# Patient Record
Sex: Female | Born: 1975 | Race: White | Hispanic: No | Marital: Married | Smoking: Never smoker
Health system: Southern US, Community
[De-identification: ages and names within clinical notes are randomized; demographics above are authoritative.]

## PROBLEM LIST (undated history)

## (undated) DIAGNOSIS — E7800 Pure hypercholesterolemia, unspecified: Secondary | ICD-10-CM

## (undated) DIAGNOSIS — M5136 Other intervertebral disc degeneration, lumbar region: Secondary | ICD-10-CM

## (undated) DIAGNOSIS — M545 Low back pain, unspecified: Secondary | ICD-10-CM

## (undated) DIAGNOSIS — L900 Lichen sclerosus et atrophicus: Secondary | ICD-10-CM

## (undated) DIAGNOSIS — G932 Benign intracranial hypertension: Secondary | ICD-10-CM

## (undated) DIAGNOSIS — F329 Major depressive disorder, single episode, unspecified: Secondary | ICD-10-CM

## (undated) DIAGNOSIS — F3289 Other specified depressive episodes: Secondary | ICD-10-CM

## (undated) DIAGNOSIS — Q210 Ventricular septal defect: Secondary | ICD-10-CM

## (undated) DIAGNOSIS — N941 Unspecified dyspareunia: Secondary | ICD-10-CM

## (undated) DIAGNOSIS — R635 Abnormal weight gain: Secondary | ICD-10-CM

## (undated) DIAGNOSIS — K219 Gastro-esophageal reflux disease without esophagitis: Secondary | ICD-10-CM

## (undated) DIAGNOSIS — G4730 Sleep apnea, unspecified: Secondary | ICD-10-CM

## (undated) DIAGNOSIS — J309 Allergic rhinitis, unspecified: Secondary | ICD-10-CM

## (undated) DIAGNOSIS — M8700 Idiopathic aseptic necrosis of unspecified bone: Secondary | ICD-10-CM

## (undated) DIAGNOSIS — D320 Benign neoplasm of cerebral meninges: Secondary | ICD-10-CM

## (undated) DIAGNOSIS — M51369 Other intervertebral disc degeneration, lumbar region without mention of lumbar back pain or lower extremity pain: Secondary | ICD-10-CM

## (undated) DIAGNOSIS — C569 Malignant neoplasm of unspecified ovary: Secondary | ICD-10-CM

## (undated) DIAGNOSIS — E041 Nontoxic single thyroid nodule: Secondary | ICD-10-CM

## (undated) DIAGNOSIS — E669 Obesity, unspecified: Secondary | ICD-10-CM

## (undated) DIAGNOSIS — G8929 Other chronic pain: Secondary | ICD-10-CM

## (undated) DIAGNOSIS — H4710 Unspecified papilledema: Secondary | ICD-10-CM

## (undated) DIAGNOSIS — R519 Headache, unspecified: Secondary | ICD-10-CM

## (undated) HISTORY — PX: THYROID SURGERY: SHX805

## (undated) HISTORY — DX: Abnormal weight gain: R63.5

## (undated) HISTORY — DX: Major depressive disorder, single episode, unspecified: F32.9

## (undated) HISTORY — DX: Other specified depressive episodes: F32.89

## (undated) HISTORY — PX: ANGIOPLASTY: SHX39

## (undated) HISTORY — DX: Benign neoplasm of cerebral meninges: D32.0

## (undated) HISTORY — PX: BRAIN SURGERY: SHX531

## (undated) HISTORY — PX: OTHER SURGICAL HISTORY: SHX169

## (undated) HISTORY — PX: OOPHORECTOMY: SHX86

## (undated) HISTORY — DX: Nontoxic single thyroid nodule: E04.1

## (undated) HISTORY — DX: Malignant neoplasm of unspecified ovary: C56.9

## (undated) HISTORY — PX: FOOT SURGERY: SHX648

## (undated) HISTORY — PX: SPINAL FUSION: SHX223

## (undated) HISTORY — DX: Allergic rhinitis, unspecified: J30.9

## (undated) HISTORY — PX: BACK SURGERY: SHX140

---

## 1998-12-23 ENCOUNTER — Ambulatory Visit (HOSPITAL_COMMUNITY): Admission: RE | Admit: 1998-12-23 | Discharge: 1998-12-23 | Payer: Self-pay

## 2001-02-20 ENCOUNTER — Ambulatory Visit (HOSPITAL_COMMUNITY): Admission: RE | Admit: 2001-02-20 | Discharge: 2001-02-20 | Payer: Self-pay | Admitting: Obstetrics and Gynecology

## 2001-02-20 ENCOUNTER — Encounter: Payer: Self-pay | Admitting: Obstetrics and Gynecology

## 2001-02-24 ENCOUNTER — Encounter (INDEPENDENT_AMBULATORY_CARE_PROVIDER_SITE_OTHER): Payer: Self-pay

## 2001-02-24 ENCOUNTER — Inpatient Hospital Stay (HOSPITAL_COMMUNITY): Admission: RE | Admit: 2001-02-24 | Discharge: 2001-02-27 | Payer: Self-pay | Admitting: Obstetrics and Gynecology

## 2001-02-24 DIAGNOSIS — C569 Malignant neoplasm of unspecified ovary: Secondary | ICD-10-CM

## 2001-02-24 HISTORY — DX: Malignant neoplasm of unspecified ovary: C56.9

## 2001-03-15 ENCOUNTER — Encounter: Payer: Self-pay | Admitting: Otolaryngology

## 2001-03-15 ENCOUNTER — Ambulatory Visit (HOSPITAL_COMMUNITY): Admission: RE | Admit: 2001-03-15 | Discharge: 2001-03-15 | Payer: Self-pay | Admitting: Otolaryngology

## 2001-03-28 ENCOUNTER — Ambulatory Visit: Admission: RE | Admit: 2001-03-28 | Discharge: 2001-03-28 | Payer: Self-pay | Admitting: Gynecology

## 2001-03-28 ENCOUNTER — Encounter: Payer: Self-pay | Admitting: Otolaryngology

## 2001-03-28 ENCOUNTER — Encounter (INDEPENDENT_AMBULATORY_CARE_PROVIDER_SITE_OTHER): Payer: Self-pay | Admitting: *Deleted

## 2001-03-28 ENCOUNTER — Ambulatory Visit (HOSPITAL_COMMUNITY): Admission: RE | Admit: 2001-03-28 | Discharge: 2001-03-28 | Payer: Self-pay | Admitting: Otolaryngology

## 2001-05-02 ENCOUNTER — Encounter: Payer: Self-pay | Admitting: Otolaryngology

## 2001-05-04 ENCOUNTER — Encounter (INDEPENDENT_AMBULATORY_CARE_PROVIDER_SITE_OTHER): Payer: Self-pay | Admitting: Specialist

## 2001-05-04 ENCOUNTER — Ambulatory Visit (HOSPITAL_COMMUNITY): Admission: RE | Admit: 2001-05-04 | Discharge: 2001-05-05 | Payer: Self-pay | Admitting: Otolaryngology

## 2001-05-04 DIAGNOSIS — D34 Benign neoplasm of thyroid gland: Secondary | ICD-10-CM

## 2001-05-04 HISTORY — DX: Benign neoplasm of thyroid gland: D34

## 2001-08-01 ENCOUNTER — Encounter: Payer: Self-pay | Admitting: Obstetrics and Gynecology

## 2001-08-01 ENCOUNTER — Ambulatory Visit (HOSPITAL_COMMUNITY): Admission: RE | Admit: 2001-08-01 | Discharge: 2001-08-01 | Payer: Self-pay | Admitting: Internal Medicine

## 2001-11-10 ENCOUNTER — Encounter: Payer: Self-pay | Admitting: Internal Medicine

## 2001-11-10 ENCOUNTER — Ambulatory Visit (HOSPITAL_COMMUNITY): Admission: RE | Admit: 2001-11-10 | Discharge: 2001-11-10 | Payer: Self-pay | Admitting: Internal Medicine

## 2001-11-27 ENCOUNTER — Ambulatory Visit (HOSPITAL_COMMUNITY): Admission: RE | Admit: 2001-11-27 | Discharge: 2001-11-27 | Payer: Self-pay | Admitting: Family Medicine

## 2001-11-27 ENCOUNTER — Encounter: Payer: Self-pay | Admitting: Family Medicine

## 2002-04-20 ENCOUNTER — Ambulatory Visit (HOSPITAL_COMMUNITY): Admission: RE | Admit: 2002-04-20 | Discharge: 2002-04-20 | Payer: Self-pay | Admitting: Emergency Medicine

## 2002-04-20 ENCOUNTER — Encounter: Payer: Self-pay | Admitting: Internal Medicine

## 2002-08-02 ENCOUNTER — Encounter: Payer: Self-pay | Admitting: Obstetrics and Gynecology

## 2002-08-02 ENCOUNTER — Ambulatory Visit (HOSPITAL_COMMUNITY): Admission: RE | Admit: 2002-08-02 | Discharge: 2002-08-02 | Payer: Self-pay | Admitting: Obstetrics and Gynecology

## 2003-02-18 ENCOUNTER — Ambulatory Visit (HOSPITAL_COMMUNITY): Admission: RE | Admit: 2003-02-18 | Discharge: 2003-02-18 | Payer: Self-pay | Admitting: Obstetrics and Gynecology

## 2003-09-06 ENCOUNTER — Ambulatory Visit (HOSPITAL_COMMUNITY): Admission: RE | Admit: 2003-09-06 | Discharge: 2003-09-06 | Payer: Self-pay | Admitting: Obstetrics and Gynecology

## 2004-03-24 ENCOUNTER — Ambulatory Visit (HOSPITAL_COMMUNITY): Admission: RE | Admit: 2004-03-24 | Discharge: 2004-03-24 | Payer: Self-pay | Admitting: Obstetrics and Gynecology

## 2004-10-16 ENCOUNTER — Ambulatory Visit (HOSPITAL_COMMUNITY): Admission: RE | Admit: 2004-10-16 | Discharge: 2004-10-16 | Payer: Self-pay | Admitting: Family Medicine

## 2004-11-03 ENCOUNTER — Ambulatory Visit (HOSPITAL_COMMUNITY): Admission: RE | Admit: 2004-11-03 | Discharge: 2004-11-03 | Payer: Self-pay | Admitting: Obstetrics and Gynecology

## 2005-06-11 ENCOUNTER — Ambulatory Visit (HOSPITAL_COMMUNITY): Admission: RE | Admit: 2005-06-11 | Discharge: 2005-06-11 | Payer: Self-pay | Admitting: Obstetrics and Gynecology

## 2005-12-02 ENCOUNTER — Ambulatory Visit (HOSPITAL_COMMUNITY): Admission: RE | Admit: 2005-12-02 | Discharge: 2005-12-02 | Payer: Self-pay | Admitting: Obstetrics and Gynecology

## 2006-07-12 ENCOUNTER — Ambulatory Visit (HOSPITAL_COMMUNITY): Admission: RE | Admit: 2006-07-12 | Discharge: 2006-07-12 | Payer: Self-pay | Admitting: Obstetrics and Gynecology

## 2007-06-23 ENCOUNTER — Encounter: Admission: RE | Admit: 2007-06-23 | Discharge: 2007-06-23 | Payer: Self-pay | Admitting: Family Medicine

## 2007-08-03 ENCOUNTER — Ambulatory Visit: Payer: Self-pay | Admitting: Cardiovascular Disease

## 2007-08-18 ENCOUNTER — Ambulatory Visit: Payer: Self-pay

## 2007-08-18 ENCOUNTER — Encounter: Payer: Self-pay | Admitting: Cardiovascular Disease

## 2007-08-23 ENCOUNTER — Encounter: Payer: Self-pay | Admitting: Endocrinology

## 2007-09-28 ENCOUNTER — Ambulatory Visit: Payer: Self-pay | Admitting: Endocrinology

## 2007-09-28 DIAGNOSIS — Z8543 Personal history of malignant neoplasm of ovary: Secondary | ICD-10-CM | POA: Insufficient documentation

## 2007-09-28 DIAGNOSIS — E041 Nontoxic single thyroid nodule: Secondary | ICD-10-CM | POA: Insufficient documentation

## 2007-09-28 DIAGNOSIS — F3289 Other specified depressive episodes: Secondary | ICD-10-CM | POA: Insufficient documentation

## 2007-09-28 DIAGNOSIS — C569 Malignant neoplasm of unspecified ovary: Secondary | ICD-10-CM | POA: Insufficient documentation

## 2007-09-28 DIAGNOSIS — D320 Benign neoplasm of cerebral meninges: Secondary | ICD-10-CM | POA: Insufficient documentation

## 2007-09-28 DIAGNOSIS — F329 Major depressive disorder, single episode, unspecified: Secondary | ICD-10-CM | POA: Insufficient documentation

## 2007-09-28 DIAGNOSIS — J309 Allergic rhinitis, unspecified: Secondary | ICD-10-CM | POA: Insufficient documentation

## 2007-09-29 ENCOUNTER — Telehealth: Payer: Self-pay | Admitting: Endocrinology

## 2008-04-02 ENCOUNTER — Encounter: Payer: Self-pay | Admitting: Family Medicine

## 2008-10-09 ENCOUNTER — Ambulatory Visit (HOSPITAL_COMMUNITY): Admission: RE | Admit: 2008-10-09 | Discharge: 2008-10-09 | Payer: Self-pay | Admitting: Obstetrics and Gynecology

## 2008-10-28 ENCOUNTER — Ambulatory Visit: Payer: Self-pay | Admitting: Physical Medicine and Rehabilitation

## 2009-05-23 ENCOUNTER — Ambulatory Visit: Payer: Self-pay | Admitting: Endocrinology

## 2009-05-23 DIAGNOSIS — R635 Abnormal weight gain: Secondary | ICD-10-CM | POA: Insufficient documentation

## 2009-05-30 ENCOUNTER — Ambulatory Visit: Payer: Self-pay | Admitting: Endocrinology

## 2009-06-01 LAB — CONVERTED CEMR LAB
BUN: 10 mg/dL (ref 6–23)
CO2: 30 meq/L (ref 19–32)
Calcium: 9.5 mg/dL (ref 8.4–10.5)
Cortisol, Plasma: 0.3 ug/dL
Direct LDL: 170.5 mg/dL
GFR calc non Af Amer: 103.57 mL/min (ref >60)
Glucose, Bld: 98 mg/dL (ref 70–99)
HDL: 44.5 mg/dL (ref >39.00)
Potassium: 3.9 meq/L (ref 3.5–5.1)
Sodium: 141 meq/L (ref 135–145)
VLDL: 20.8 mg/dL (ref 0.0–40.0)

## 2009-07-07 ENCOUNTER — Telehealth: Payer: Self-pay | Admitting: Endocrinology

## 2009-12-04 ENCOUNTER — Inpatient Hospital Stay (HOSPITAL_COMMUNITY): Admission: RE | Admit: 2009-12-04 | Discharge: 2009-12-05 | Payer: Self-pay | Admitting: Orthopedic Surgery

## 2010-03-28 ENCOUNTER — Encounter: Payer: Self-pay | Admitting: Obstetrics and Gynecology

## 2010-04-09 NOTE — Assessment & Plan Note (Signed)
Summary: LAST SEEN 7/09, REQ LAB/TRIAGE SAID OV/CD   Vital Signs:  Patient profile:   35 year old female Height:      69 inches (175.26 cm) Weight:      217.75 pounds (98.98 kg) BMI:     32.27 O2 Sat:      95 % on Room air Temp:     97.2 degrees F (36.22 degrees C) oral Pulse rate:   92 / minute BP sitting:   110 / 78  (left arm) Cuff size:   regular  Vitals Entered By: Josph Macho RMA (May 23, 2009 10:54 AM)  O2 Flow:  Room air CC: Office Visit/ pt states she is no longer taking Omeprazole, Tri-Sprintec, or Synthroid/ CF   Primary Provider:  Lonie Peak, PA  CC:  Office Visit/ pt states she is no longer taking Omeprazole, Tri-Sprintec, and or Synthroid/ CF.  History of Present Illness: pt had right thyroid lobectomy in 2009.  pathol was benign.  pt says she does not notice any lump in the neck. she stopped synthroid.  she c/o weight gain. she also c/o excessive appetite.  Current Medications (verified): 1)  Omeprazole 20 Mg  Cpdr (Omeprazole) .... Take 1 By Mouth Once Daily Prn 2)  Tri-Sprintec 0.035 Mg  Tabs (Norgestimate-Ethinyl Estradiol) .... Take 1 By Mouth Qd 3)  Fluticasone Propionate 50 Mcg/act  Susp (Fluticasone Propionate) .... Use Prn 4)  Kls Aller-Tec 10 Mg  Tabs (Cetirizine Hcl) .... Take 1 By Mouth Qd 5)  Allergy Shot .... Get Once A Week 6)  Synthroid 25 Mcg  Tabs (Levothyroxine Sodium) .... Qd 7)  Pristiq .... Daily 8)  Ambien .... Once Daily  Allergies (verified): 1)  ! 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  pristiq helps depression.  Physical Exam  General:  obese.   Skin:  no darkened striae on the abdomen.   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 change  Problem # 2:  weight gain uncertain etiology  Medications Added to Medication List This Visit: 1)  Pristiq  .... Daily 2)  Ambien  .... Once daily 3)  Dexamethasone 1 Mg Tabs (Dexamethasone) .... Take at 10 pm, the night before blood test  Other Orders: Est. Patient Level III (62952)  Patient Instructions: 1)  you should do a "dexamethasone suppression test."  for this, you would take dexamethasone 1 mg at 10 pm, then come in for a "cortisol" blood test the next morning before 9 am.  you do not need to be fasting for this test. 2)  we'll also check the thyroid, blood sugar, and cholesterol at the same time:  tsh 241.0    bmet 783.1  lipids 783.1 cortisol 783.1 Prescriptions: DEXAMETHASONE 1 MG TABS (DEXAMETHASONE) take at 10 pm, the night before blood test  #1 x 0   Entered and Authorized by:   Minus Breeding MD   Signed by:   Minus Breeding MD on 05/23/2009   Method used:   [facility] [phone][facility] (retail)       [address]       [place]  [place], [place]  [address]       Ph: [phone] or [phone]       Fax: [phone]   RxID:   [id number]

## 2010-04-09 NOTE — Progress Notes (Signed)
Summary: Rx request  Phone Note Call from Patient Call back at Home Phone [phone]   Caller: Patient Summary of Call: pt called stating that she would like to try Rx for Synthroid as discussed at last OV. Pt is [facility] Initial call taken by: Margaret Pyle, CMA,  Jul 07, 2009 4:50 PM  Follow-up for Phone Call        i believe this means simvastatin.  i sent rx.  go to lab in 1 month for lipids 272.0 and liver v58.69. Follow-up by: Minus Breeding MD,  Jul 07, 2009 4:52 PM  Additional Follow-up for Phone Call Additional follow up Details #1::        pt said that after having thyroid removed she was told by MD that she could start thyroid medication if she wanted. Pt says she thought about it and wants to try Throid medication. please advise Additional Follow-up by: Margaret Pyle, CMA,  Jul 07, 2009 4:55 PM    New/Updated Medications: SIMVASTATIN 40 MG TABS (SIMVASTATIN) 1 at bedtime Prescriptions: SIMVASTATIN 40 MG TABS (SIMVASTATIN) 1 at bedtime  #30 x 11   Entered and Authorized by:   Minus Breeding MD   Signed by:   Minus Breeding MD on 07/07/2009   Method used:   Electronically to        [facility] [phone]* (retail)       [address]       [place], [place]  [address]       Ph: [phone] or [phone]       Fax: [phone]   RxID:   [id number]   Appended Document: Rx request i sent rx for synthroid 25 micrograms/day Yuna Pizzolato, md  pt informed via VM Margaret Pyle, CMA  Jul 08, 2009 8:37 AM   Clinical Lists Changes  Medications: Added new medication of LEVOTHYROXINE SODIUM 25 MCG TABS (LEVOTHYROXINE SODIUM) 1 once daily - Signed Rx of LEVOTHYROXINE SODIUM 25 MCG TABS (LEVOTHYROXINE SODIUM) 1 once daily;  #30 x 11;  Signed;  Entered by: Minus Breeding MD;  Authorized by: Minus Breeding MD;  Method used: Electronically to [facility] [phone] [address], [place], [place], [place]  [address], Ph: [phone] or [phone], Fax: [phone]    Prescriptions: LEVOTHYROXINE SODIUM 25 MCG TABS (LEVOTHYROXINE SODIUM) 1 once daily  #30 x 11   Entered and Authorized by:   Minus Breeding MD   Signed by:   Minus Breeding MD on 07/07/2009   Method used:   Electronically to        [facility] [phone]* (retail)       [address]       [place], [place]  [address]       Ph: [phone] or [phone]       Fax: [phone]   RxID:   [id number]

## 2010-05-21 LAB — SURGICAL PCR SCREEN
MRSA, PCR: NEGATIVE
Staphylococcus aureus: NEGATIVE

## 2010-05-21 LAB — CBC
HCT: 41.7 % (ref 36.0–46.0)
MCHC: 33.6 g/dL (ref 30.0–36.0)
RDW: 12 % (ref 11.5–15.5)
WBC: 7.3 10*3/uL (ref 4.0–10.5)

## 2010-07-21 NOTE — Assessment & Plan Note (Signed)
[facility]                            CARDIOLOGY OFFICE NOTE   NAME:[name], [name]                      MRN:          [id number]  DATE:08/03/2007                            DOB:          [birth year]    [name] is a delightful 35 year old granddaughter of [name]  who I have taken care of for years.  She has history of congenital heart  disease with membranous VSD.  Unfortunately I do not have any records.  She has seen  cardiologist in [place].  The only records I was provided  with were from [facility].  These were reviewed x20 minutes.  The patient has a small membranous VSD which was in the perimembranous  area.  It was quoted as having little hemodynamic importance.  She  does not have any evidence of pulmonary hypertension or RV overload.   The patient has been doing well.  She is active.  She is not having  dyspnea, palpitations, syncope or chest pain.  She has not had an echo  in the last year or so.  Her last one was in [place].  Unfortunately  she does not recall who her heart doctor was, but she did not seem to  get along with them.   In particular she had mentioned to her that the VSD may close.  I agreed  with the patient that at her current age it would not.  Finally the  patient is quite wedded to antibiotic prophylaxis using clindamycin.  She is allergic to PENICILLIN.  The new guidelines would theoretically  say that she does not need it.  However, given the high velocity jet of  the VSD and the fact that she has done it before with no consequences, I  would again agree with the patient that she should continue her  clindamycin therapy an hour before any dental procedure.  She does see  the dentist twice a year.  Otherwise, her heart has been doing well.  Her murmur does not seem to have changed.  She is not having any  significant issues.  I did talk to her about pregnancy.  She has birth  control.  She has  had the right ovary removed by Dr. Sherilyn Cooter for cancer.  She is not in a hurry to start a family.   I told her there would be no contraindications.   REVIEW OF SYSTEMS:  Remarkable for a myriad of other problems which is  part of the reason she is not anxious to start a family.  She has had a  thyroid tumor removed.  She has had ovarian cancer, and apparently now  she has either a large meningioma or a brain tumor that may need  surgery.  She has seen Dr. Newell Coral here.  She has had a recent MRI at  [facility] which showed an increase in the size of the meningioma.  She is  getting an opinion from [facility] and also from Dr. Zachery Conch at [facility].  She  seems a bit anxious about this.   PAST MEDICAL HISTORY:  Otherwise, remarkable  for previous ovarian  cancer, previous thyroid cancer, previous back problems.   ALLERGIES:  The patient is allergic to PENICILLIN.   CURRENT MEDICATIONS:  1. Alertec.  2. Bupropion.  3. Fluoxetine.  4. Tri-Sprintec.  5. Omeprazole 20 a day.   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   She is married with no children.  Her husband works at [employer].  She is  originally from [place].   As indicated I take care of her grandmother, [name]   The patient is fairly sedentary.  She does not smoke or drink.   FAMILY HISTORY:  Remarkable for mother and father both being alive.  No  premature congenital disease.   PHYSICAL EXAMINATION:  GENERAL:  Remarkable for healthy-appearing young  white female in no distress.  VITAL SIGNS:  Weight is 199, blood pressure 126/73, pulse 76 and  regular, afebrile, respiratory rate 14-18.  HEENT:  Unremarkable.  NECK:  She is status post thyroidectomy.  No lymphadenopathy, no JVP  elevation, no bruit.  LUNGS:  Clear, good diaphragmatic motion.  No wheezing.  HEART:  There is an S1, S2 with a VSD murmur in the left lateral sternal  border.  No RV heave.  ABDOMEN:  Benign.  Bowel  sounds positive, no AAA, no tenderness, no  hepatosplenomegaly, hepatojugular reflux, no tenderness.  EXTREMITIES:  Distal pulses are intact, no edema.  NEUROLOGICAL:  Nonfocal.  SKIN:  Warm and dry.  MUSCULOSKELETAL:  No muscular weakness.   IMPRESSION:  1. Murmur benign.  Perimembranous ventriculoseptal defect.  No      evidence of pulmonary hypertension or volume overload.  Follow-up      echocardiogram in six months unless we can get more reports from      [place].  2. Endocarditis prophylaxis.  Continue per patient request, and also I      think with a high velocity jet of a ventriculoseptal defect it is      probably prudent to use clindamycin given PENICILLIN allergy.  3. Birth control.  Continue current replacement.  No contraindication      to pregnancy just due to ventriculoseptal defect.  4. Thyroid tumor.  Follow TSH and T4.  Follow up with Dr. Lurene Shadow.  5. Meningioma.  Question growing.  Question need for surgery.  Cleared      to have any type of gamma knife surgery that she may need.  Follow-      up at [facility] and possible third opinion per Dr. Zachery Conch at [facility].  6. Allergies.  Continue shots.   I will see the patient back in six months.     [name]. [name], MD, [facility]  Electronically Signed    PCN/MedQ  DD: 08/03/2007  DT: 08/03/2007  Job #: [id number]   cc:   Malachi Pro. Ambrose Mantle, M.D.

## 2010-07-21 NOTE — Assessment & Plan Note (Signed)
[facility]                            CARDIOLOGY OFFICE NOTE   NAME:[name], [name]                      MRN:          [id number]  DATE:08/03/2007                            DOB:          09/22/1975    [name] is a 35 year old patient referred by Dr. __________ and Dr.  Ambrose Mantle.   INCOMPLETE DICTATION.     [name]. [name], MD, [facility]     PCN/MedQ  DD: 08/03/2007  DT: 08/03/2007  Job #: [id number]

## 2010-07-24 NOTE — Discharge Summary (Signed)
[facility]  Patient:    [name], [name] Visit Number: [id number] MRN: [id number]          Service Type: GYN Location: 4W 0463 01 Attending Physician:  Malon Kindle Dictated by:   Malachi Pro. Ambrose Mantle, M.D. Admit Date:  02/24/2001 Discharge Date: 02/27/2001   CC:         Zigmund Daniel, M.D.   Discharge Summary  HISTORY OF PRESENT ILLNESS/HOSPITAL COURSE: This is a 35 year old white female with a large abdominopelvic mass who underwent laparotomy, right salpingo-oophorectomy, with frozen section findings of right ovarian adenocarcinoma, appendectomy, biopsy of the peritoneum, omentectomy, bilateral lymph node dissection of the iliac and para-aortic nodes.  The right salpingo-oophorectomy and appendectomy were done by Dr. Ambrose Mantle.  The remainder of the procedure was done by Dr. Orson Slick.  Procedure was done under general anesthesia.  Blood loss was about 100 cc.  Postoperatively, the patient did quite well and was passing flatus freely and was discharged on the third postoperative day.  Her staples were left in place.  Her initial hemoglobin was 13.3, hematocrit 37.2, white count 5000, platelet count 187,000. Follow-up hemoglobins x 3 were stable.  The final CBC was done because the nurse reported that the patient had some upper back pain and seemed to have some ecchymoses.  However, on my exam, I was quite unimpressed with any ecchymoses on the back, but the CBC did show a stable blood count.  Her comprehensive metabolic profile was normal.  TSH done to evaluate a right thyroid nodule was 0.933.  CA125 was 18.0.  Urinalysis was negative. Pathology report is not available at this time.  FINAL DIAGNOSES: 1. Adenocarcinoma of the right ovary, lymph node samples, peritoneal samples,    omentectomy samples, and appendectomy samples pending at the present time. 2. Right thyroid nodule. 3. 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  OPERATION:  Right  salpingo-oophorectomy, appendectomy, biopsy of pelvic peritoneum, omentectomy, and para-aortic and iliac lymph node dissections.  FINAL CONDITION:  Improved.  INSTRUCTIONS:  Regular diet.  No vaginal entrance.  No heavy lifting or strenuous activity.  Call with any fever greater than 100.4 degrees.  Call with any unusual problems.  Return to the office in four to seven days to have her staples removed and follow-up exam.  PRESCRIPTIONS AT DISCHARGE:  Mederma and Percocet 5/325 16 tablets one every 4 to 6 hours as needed for pain. Dictated by:   Malachi Pro. Ambrose Mantle, M.D. Attending Physician:  Malon Kindle DD:  02/27/01 TD:  02/28/01 Job: [id number]

## 2010-07-24 NOTE — Op Note (Signed)
[facility]  Patient:    [name], [name] Visit Number: [id number] MRN: [id number]          Service Type: GYN Location: 4W 0463 01 Attending Physician:  Malon Kindle Dictated by:   Zigmund Daniel, M.D. Proc. Date: 02/24/01 Admit Date:  02/24/2001   CC:         Malachi Pro. Ambrose Mantle, M.D.   Operative Report  PREOPERATIVE DIAGNOSIS:  Cancer of the right ovary.  POSTOPERATIVE DIAGNOSIS:  Cancer of the right ovary.  OPERATION PERFORMED:  1. Omentectomy.  2. Biopsies of the peritoneum.  3. Excision of lymph nodes from the left and right iliac regions and     the periaortic region.  SURGEON:  Zigmund Daniel, M.D.  ASSISTANT:  Malachi Pro. Ambrose Mantle, M.D.  DESCRIPTION OF PROCEDURE:  Dr. Ambrose Mantle had done a right oophorectomy and diagnosed cancer. He had also done an appendectomy which appeared to be adequate. He asked to come in for the above named procedures. The patient was well exposed with the lower midline incision of adequate length and anatomy was easy. The right ureter was already exposed in the area of dissection of the infundibulopelvic ligament. I extended that excision a bit cephalad and exposed the right iliac vessels at the bifurcation and dissected the ureter away and noted the iliac vein and carefully avoided harm. I excised a generous portion of fat and lymph node bearing tissue from around the artery and vein getting hemostasis with suture ligatures and clips as needed and using the Bovie as well. I then made an incision along the left iliac vessels lateral to the sigmoid colon, exposed the bifurcation and did a similar excision taking care to avoid injury to the ureter. Hemostasis again was good. I biopsied the peritoneum and both lateral pelvic areas as requested by Dr. Ambrose Mantle. I then made an incision over the distal aorta and removed some lymph nodes anterior to the aorta, lateral to the aorta on the left and over the vena  cava taking great care to avoid injury to arteries and veins. I noted and avoided injury to the inferior mesenteric artery. Hemostasis again was good. I closed the retroperitoneum with running 2-0 Vicryl and again felt that there was no evidence of expanding hematoma or other ongoing problem. I remained and assisted Dr. Ambrose Mantle with closure of the abdomen. I did explore the upper abdomen myself and found no evidence of hepatic metastasis, peritoneal metastasis or metastasis on the diaphragm. It was also requested that we remove the greater omentum and I segmentally divided it between [facility] clamps taking the large apron of omentum near the colon taking great care to avoid injury to the colon mesentery and to the stomach blood supply. I ligated it with #0 Vicryl. The patient was stable throughout. Dictated by:   Zigmund Daniel, M.D. Attending Physician:  Malon Kindle DD:  02/24/01 TD:  02/25/01 Job: [id number]

## 2010-07-24 NOTE — Consult Note (Signed)
[facility]  Patient:    [name], [name] Visit Number: [id number] MRN: [id number]          Service Type: GON Location: GYN Attending Physician:  Jeannette Corpus Dictated by:   Rande Brunt. Clarke-Pearson, M.D. Proc. Date: 03/28/01 Admit Date:  03/28/2001   CC:         Lowell C. Catha Gosselin, M.D.  Malachi Pro. Ambrose Mantle, M.D.  Zigmund Daniel, M.D.  Telford Nab, R.N.   Consultation Report  HISTORY OF PRESENT ILLNESS:  [name] is a 35 year old white female seen upon referral from Dr. Lyndal Pulley.  The patient underwent exploratory laparotomy on December 20 by Dr. Tracey Harries and Dr. Lebron Conners for evaluation and management of a large right ovarian tumor.  At the time of surgery, the patient was found to have a mucinous cystadenocarcinoma of the right ovary and subsequently underwent additional surgical staging including peritoneal washings, omentectomy, peritoneal biopsies, and excision of lymph nodes from the left and right iliac regions as well as the periaortic region.  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  The appendix and omentum and peritoneal biopsies as well as washings were negative.  Two right pelvic lymph nodes and one left pelvic lymph node were removed and were negative.  The periaortic specimen contained one lymph node.  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  The left ovary and uterus were preserved as the patient desired future fertility.  She has had an uncomplicated postoperative course and was referred to Dr. Catha Gosselin for consideration of adjuvant therapy.  PAST MEDICAL HISTORY:  None.  The patient is a healthy young woman.  PAST SURGICAL HISTORY:  The patient had a ganglion cyst removed from  her wrist.  ALLERGIES:  PENICILLIN.  CURRENT MEDICATIONS:  Flonase p.r.n.  GYN HISTORY:  Gravida 0.  The patient has previously used oral contraceptives.  FAMILY HISTORY:  The patient has a grandmother who had endometrial cancer and a great aunt who had pancreatic cancer.  There is no other gynecologic or breast cancer in the patients history.  REVIEW OF SYSTEMS:  Essentially negative.  PHYSICAL EXAMINATION:  VITAL SIGNS:  Weight 147 pounds, height 5 feet 9 inches, blood pressure 98/65.  The remainder of the physical exam is deferred because of the patients recent surgery.  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  The patient reports that she had a negative CA125 preoperatively and so this may not be a very useful tumor marker.  RECOMMENDATION:  With regard to management, this patient has a very low risk lesion given that it is confined to the ovary and is only moderately differentiated.  I would not think that additional chemotherapy at this juncture would be of any apparent benefit to the patients overall prognosis which should be excellent.  Would recommend that the patient be followed expectantly with visits to Dr. Ambrose Mantle every three months for the first two years of follow up.  Would also recommend that she have CEA values obtained at each visit along with CA125s (despite the fact they were negative to begin with).  Further, I would recommend that Dr. Ambrose Mantle obtain ultrasound to evaluate the left ovary every six months for five years and further would recommend the patient strongly consider being placed on oral contraceptives  to suppress functional ovarian cysts.  Thank you for the opportunity to offer my opinions in the management of this patient.  I would be happy to see her back in the future as needed. Dictated by:   Rande Brunt. Clarke-Pearson, M.D. Attending Physician:  Jeannette Corpus DD:  03/29/01 TD:  03/29/01 Job:  [id number]

## 2010-07-24 NOTE — Op Note (Signed)
[facility]  Patient:    ARETHA, LEVI Visit Number: [id number] MRN: [id number]          Service Type: GYN Location: 4W 0463 01 Attending Physician:  Malon Kindle Dictated by:   Malachi Pro. Ambrose Mantle, M.D. Proc. Date: 02/24/01 Admit Date:  02/24/2001   CC:         Zigmund Daniel, M.D.   Operative Report  PREOPERATIVE DIAGNOSIS:  Large abdominopelvic mass.  POSTOPERATIVE DIAGNOSIS:  Adenocarcinoma of the right ovary.  OPERATION PERFORMED:  Right salpingo-oophorectomy, peritoneal biopsies, omentectomy, bilateral lymph node dissection of the iliac and periaortic nodes.  SURGEONS:  1. Malachi Pro. Ambrose Mantle, M.D.  2. Zigmund Daniel, M.D.  ASSISTANT:  Zenaida Niece, M.D.  ANESTHESIA:  General.  DESCRIPTION OF PROCEDURE:  The patient was brought to the operating room and placed under satisfactory general anesthesia. The abdomen, vulva, vagina and urethra were prepped with Betadine solution. The bladder was catheterized with a Foley and hooked to straight drain. The mass arising from the pelvis extended 2 or 3 cm above the umbilicus. After the abdomen was draped as a sterile field, a midline incision was made and carried in layers through the skin, subcutaneous tissue and fascia. The peritoneum was opened and the abdomen was explored. The abdominal and pelvic washings were taken first and then the exploration of the abdomen revealed that this large mass was arising from the right ovary and was not adherent in any way to surrounding tissues. The left ovary appeared normal as did the left tube. The right tube was intimately involved with the mass and I did not feel like it was possible to save the right tube. The anterior and posterior pelvic pelvis were free of disease. The upper abdomen was explored. The omentum was normal. There were no significant lymph node enlargements. The liver was completely normal as were both leads of the  diaphragm. There were no upper abdominal abnormalities. The mass was elevated into the operative field. The right infundibulopelvic ligament was exposed and doubly ligated after it was cut between clamps. The right round ligament was divided and the huge mass was removed by transecting the mesovarium and mesosalpinx. This was sent for frozen section. The ureters were bilaterally identified and seemed to be well free of the operative field. The frozen section came back as adenocarcinoma of the ovary probably mucinous well differentiated. I called Dr. Carey Bullocks to make sure that she felt like leaving the uterus and left tube and ovary was appropriate. She agreed that leaving the left tube and ovary and uterus were appropriate and that we should proceed with appendectomy, omentectomy, lymph node dissection, and peritoneal biopsies. Therefore I removed the appendix by transecting the mesoappendix ligating it with #0 Vicryl ties, crushing the base of the appendix ligating it with a #0 Vicryl tie and cutting the stump between the tie and the clamp. I bovied the base of the appendix just gently and then buried the stump beneath a 3-0 silk pursestring suture. At this point, Dr. Orson Slick arrived in the operating room. He did an omentectomy, iliac and periaortic lymph node dissection and biopsied the peritoneum lateral to the infundibulopelvic ligament on both sides. He also explored the abdomen and found no evidence of any extra ovarian disease. He then reperitonealized the pelvic side walls and the abdomen was closed with a running suture of #1 PDS going from top to bottom and tying it in the middle. The subcu tissue was closed with  a running 3-0 Vicryl and the skin was closed with automatic staples. All packs and retractors removed prior to closure. Sponge and needles counts were correct and the procedure was terminated. The patient was returned to recovery in satisfactory condition. Blood loss was  estimated about 100 cc. Dictated by:   Malachi Pro. Ambrose Mantle, M.D. Attending Physician:  Malon Kindle DD:  02/24/01 TD:  02/25/01 Job: [id number]

## 2010-07-24 NOTE — H&P (Signed)
[facility]  Patient:    [name], [name] Visit Number: [id number] MRN: [id number]          Service Type: GYN Location: 4W 0463 01 Attending Physician:  Malon Kindle Dictated by:   Malachi Pro. Ambrose Mantle, M.D. Admit Date:  02/24/2001                           History and Physical  HISTORY OF PRESENT ILLNESS:  This is a 35 year old white female admitted to the hospital for surgical exploration of a large abdominopelvic mass.  Last menstrual period February 09, 2001.  This patient reports that she was seen for evaluation of pelvic pain on January 23, 2001, at [facility]. She was thought to have pelvic infection and was given Zyprexa and Cipro for 12 hours.  She was seen again February 15, 2001, and was found to have an abdominal mass.  She was referred to Dr. Ashley Royalty, but he did not participate in the health care initiative program, so she was referred to me.  Examination confirmed an abdominal mass that extended above the umbilicus.  An ultrasound showed a normal-appearing left ovary, a normal-appearing uterus, and a pelvic mass that measured 22 x 8 x 21 cm.  It was predominantly cystic in nature, with numerous thick septations.  The kidney scan demonstrated moderate right hydronephrosis.  Impression was a large complex cystic mass that was presumed to be of right ovarian origin and was felt to represent a cystadenoma.  There was no ascites.  The patient was scheduled for surgery.  She claims to be having abdominal pain; however, her abdomen has not indicated a need for emergency surgery.  ALLERGIES:  PENICILLIN.  PAST SURGICAL HISTORY:  Ganglion cyst removed.  PAST MEDICAL HISTORY:  Usual childhood diseases.  SOCIAL HISTORY:  Alcohol and tobacco, none.  The patient is a Film/video editor. She graduated from [employer] in [place].  She uses birth control pills and condoms.  FAMILY HISTORY:  Mother, 69, with  depression.  Father, 61, with diabetes and high blood pressure and sleep apnea.  Two sisters and one brother are living and well.  The patient does give a history of a heart murmur but is unable to give any significant historical findings about the nature of the abnormality.  PHYSICAL EXAMINATION:  GENERAL:  Well-developed, well-nourished white female.  VITAL SIGNS:  Weight 153 pounds.  Blood pressure 110/70, pulse 80.  HEENT:  No cranial abnormalities.  Extraocular movements intact.  Nose and pharynx clear.  NECK:  Supple.  Without thyromegaly.  HEART:  Normal size and sounds.  Does show a 2/6 holosystolic murmur at the fourth left intercostal space consistent with a VSD murmur.  LUNGS:  Clear to P&A.  ABDOMEN:  Soft, and not especially tender.  There is a mass that arises from the pelvis and extends to about 3 cm above the umbilicus.  The mass feels well encapsulated and is cystic.  PELVIC:  The vulva and vagina are clean.  The cervix is clean.  The uterus is hard to feel.  Adnexa are filled with this large abdominopelvic mass.  ADMISSION IMPRESSION:  Large abdominopelvic mass.  The patient is admitted for surgical exploration.  Please also note that she has been on Ortho Tri-Cyclen. She has also been on Flonase and Zyrtec.  She understands that there are risks involved in surgery, that the surgery might even include abdominal hysterectomy and bilateral  salpingo-oophorectomy in case the mass is malignant. Dictated by:   Malachi Pro. Ambrose Mantle, M.D. Attending Physician:  Malon Kindle DD:  02/23/01 TD:  02/24/01 Job: [id number]

## 2010-12-23 ENCOUNTER — Encounter (HOSPITAL_COMMUNITY)
Admission: RE | Admit: 2010-12-23 | Discharge: 2010-12-23 | Disposition: A | Discharge status: Home / Self Care | Payer: 59 | Source: Ambulatory Visit | Attending: Orthopedic Surgery | Admitting: Orthopedic Surgery

## 2010-12-23 ENCOUNTER — Other Ambulatory Visit (HOSPITAL_COMMUNITY): Payer: Self-pay | Admitting: Orthopedic Surgery

## 2010-12-23 DIAGNOSIS — M549 Dorsalgia, unspecified: Secondary | ICD-10-CM

## 2010-12-23 LAB — BASIC METABOLIC PANEL
BUN: 11 mg/dL (ref 6–23)
CO2: 27 mEq/L (ref 19–32)
Calcium: 9.7 mg/dL (ref 8.4–10.5)
Chloride: 102 mEq/L (ref 96–112)
Creatinine, Ser: 0.75 mg/dL (ref 0.50–1.10)
GFR calc Af Amer: >90 mL/min (ref >90)
GFR calc non Af Amer: >90 mL/min (ref >90)
Glucose, Bld: 89 mg/dL (ref 70–99)
Potassium: 4 mEq/L (ref 3.5–5.1)
Sodium: 138 mEq/L (ref 135–145)

## 2010-12-23 LAB — CBC
HCT: 39.9 % (ref 36.0–46.0)
Hemoglobin: 13.2 g/dL (ref 12.0–15.0)
MCH: 29.5 pg (ref 26.0–34.0)
MCHC: 33.1 g/dL (ref 30.0–36.0)
MCV: 89.3 fL (ref 78.0–100.0)
Platelets: 232 10*3/uL (ref 150–400)
RBC: 4.47 MIL/uL (ref 3.87–5.11)
RDW: 12.4 % (ref 11.5–15.5)
WBC: 6.1 10*3/uL (ref 4.0–10.5)

## 2010-12-23 LAB — TYPE AND SCREEN
ABO/RH(D): O POS
Antibody Screen: NEGATIVE

## 2010-12-23 LAB — HCG, SERUM, QUALITATIVE: Preg, Serum: NEGATIVE

## 2010-12-23 LAB — ABO/RH: ABO/RH(D): O POS

## 2010-12-24 ENCOUNTER — Inpatient Hospital Stay (HOSPITAL_COMMUNITY): Payer: 59

## 2010-12-24 ENCOUNTER — Ambulatory Visit (HOSPITAL_COMMUNITY)
Admission: RE | Admit: 2010-12-24 | Discharge: 2010-12-25 | DRG: 460 | Disposition: A | Discharge status: Home / Self Care | Payer: 59 | Source: Ambulatory Visit | Attending: Orthopedic Surgery | Admitting: Orthopedic Surgery

## 2010-12-24 DIAGNOSIS — Z79899 Other long term (current) drug therapy: Secondary | ICD-10-CM

## 2010-12-24 DIAGNOSIS — Z859 Personal history of malignant neoplasm, unspecified: Secondary | ICD-10-CM

## 2010-12-24 DIAGNOSIS — Z880 Allergy status to penicillin: Secondary | ICD-10-CM

## 2010-12-24 DIAGNOSIS — F329 Major depressive disorder, single episode, unspecified: Secondary | ICD-10-CM | POA: Diagnosis present

## 2010-12-24 DIAGNOSIS — Z01818 Encounter for other preprocedural examination: Secondary | ICD-10-CM

## 2010-12-24 DIAGNOSIS — M47817 Spondylosis without myelopathy or radiculopathy, lumbosacral region: Secondary | ICD-10-CM | POA: Diagnosis present

## 2010-12-24 DIAGNOSIS — F3289 Other specified depressive episodes: Secondary | ICD-10-CM | POA: Diagnosis present

## 2010-12-24 DIAGNOSIS — Z01812 Encounter for preprocedural laboratory examination: Secondary | ICD-10-CM

## 2010-12-24 DIAGNOSIS — M961 Postlaminectomy syndrome, not elsewhere classified: Secondary | ICD-10-CM | POA: Diagnosis present

## 2010-12-24 DIAGNOSIS — M5126 Other intervertebral disc displacement, lumbar region: Secondary | ICD-10-CM | POA: Diagnosis present

## 2010-12-25 ENCOUNTER — Inpatient Hospital Stay (HOSPITAL_COMMUNITY): Payer: 59

## 2010-12-28 NOTE — Op Note (Signed)
NAMEADRIEL, [name] NO.:  [id number]  MEDICAL RECORD NO.:  [id number]  LOCATION:  5028                         FACILITY:  [facility]  PHYSICIAN:  Alvy Beal, MD    DATE OF BIRTH:  03/04/1976  DATE OF PROCEDURE:  12/24/2010 DATE OF DISCHARGE:                              OPERATIVE REPORT   PREOPERATIVE DIAGNOSIS:  Postlaminectomy syndrome with discogenic back pain, L3-L4.  POSTOPERATIVE DIAGNOSIS:  Postlaminectomy syndrome with discogenic back pain, L3-L4.  OPERATIVE PROCEDURES: 1. Lateral interbody fusion L3-L4 using a DePuy Carbon Fiber cage, it     was 55 mm in length, 12 mm high, lordotic, 18 mm wide.  With     unilateral pedicle screw fixation in 3 posterior lateral     arthrodesis, L3-L4.  Pedicle screw system used was a Synthes matrix     pedicle screw, MIS pedicle screw system; 45-mm screw at L4, 40-mm     screw at L3 with a 45-mm locking rod.  COMPLICATIONS:  None.  FIRST ASSISTANT:  Norval Gable, PA  Intraoperative evoked sensory potentials remained normal.  He has free running EMGs remained normal.  There was no adverse neurological sequelae.  HISTORY:  She is a very pleasant 35 year old woman who, about 7 months to a year ago, underwent an L3-L4 diskectomy.  Her radicular leg pain improved, but she continued to have horrific disabling back pain. Despite activity modification, analgesic medications, injection therapy, her pain persisted.  As a result of the ongoing issues, we elected to proceed with surgery.  All appropriate risks, benefits, and alternatives were discussed with the patient and consent was obtained.  OPERATIVE NOTE:  The patient was brought to the operating room, placed supine on the operating table.  After successful induction of general anesthesia endotracheal intubation, TEDs, SCDs, and a Foley were inserted.  The neuro monitoring represented from nurse team, then inserted all of the appropriate needles for monitoring  and she was turned into the lateral decubitus position, left side up.  Axillary roll was positioned.  All bony prominences were well padded and the patient was taped down securely to the bed.  She was properly positioned for the XLIF (lateral fusion).  Lateral and posterior aspect of the spine were prepped and draped in standard fashion.  Appropriate time-out was then done to confirm patient, procedure, and all other pertinent important data.  Once this was done, I then identified the L3-L4 disk space on the lateral plane and marked the skin.  The skin was infiltrated with Marcaine with epinephrine and an incision was made.  Sharp dissection was carried out through the adipose tissue down to the fascia of the external oblique.  The fascia was just lightly incised.  Then, I identified the second incision site on the posterolateral aspect one fingerbreadth away from my lateral incision.  I made an incision the size of my finger, dissected down to the posterior retroperitoneal fascia, and then popped through this with a Vista Lawman.  I then put my finger through that defect and then I was able to palpate the ventral surface of the transverse process along the twelfth rib and I began mobilizing and clearing out the  retroperitoneal space.  I then was able to palpate the surface of the iliopsoas muscle.  I then brought my finger up to the external oblique and then bluntly dissected down to my finger.  Once I was down to my finger, I put the first cannula on my finger and then brought it down to the surface of the psoas.  I then probed the surface of the psoas until I identified where the lumbar plexus was.  Once I had a reading consistent with where the nerve was, I repositioned my trocar so that I was just over the disk space and away from the lumbar plexus.  Once I was properly positioned, I went through the psoas down to the lateral aspect of the disk space.  I confirmed satisfactory  position in both planes.  Once this was confirmed, I then sequentially dilated and each time I tested in a circumferential pattern to ensure that I was not damaging the lumbar plexus.  Once I confirmed this, I put the retracting blades in, secured it to the table, and gently expanded the retractors so I could see the disk space.  Once I had clear visualization of the space, I then retested with probe so that I could confirm that my retractor blades were not damaging the plexus and they were not.  I then incised the lateral aspect of the disk with a scalpel and then used a combination of pituitary rongeurs, curettes, and Kerrison rongeurs to remove all the disk material.  I had an excellent channel diskectomy.  I then rasped the endplates and then used a Cobb elevator to make sure I released the contralateral anulus.  Once I had released this, I then used sequential trials until I found that the 12 provided the best fit.  Once I confirmed this, I then irrigated copiously with normal saline, obtained a 55 x 12 lordotic 18-mm cage. This was packed with DBX Mix and then malleted to the appropriate depth. Once I had the cage to the appropriate depth, I irrigated it copiously with normal saline and used bipolar electrocautery to obtain hemostasis. I then gently began removing the retractor to ensure there was no bleeding within the substance of the psoas.  I then closed the fascia of the external oblique with a 0 Vicryl suture and then closed the superficial with 2-0 and then a 3-0 Monocryl for the skin.  The second posterior incision that I had made was also irrigated and closed in a similar fashion.  The table was then leveled out and then identified the lateral L3 and L4 pedicle borders.  I made a small incision connecting this and then did a __________ dissection down to the lateral aspect of the facet capsule.  I was able to palpate the transverse processes of L3 and L4.  I then placed a  Jamshidi needle on the junction of the transverse process and facet in the midline.  I then connected this to the neuromonitoring device and using fluoroscopic guidance as well as neuromonitoring, advanced the Jamshidi needle through the pedicle and into the vertebral body.  I repeated this at L4.  I then placed guidepins.  Over the guidepins, I tapped and again a tap was also connected to the neuromonitoring as another precaution to ensure that I was not breaching the pedicle wall.  I then placed a 45-mm 6-0 pedicle screw at L4 and a 40-mm pedicle screw at L3.  I then stimulated both screws and again, there was no evidence  of breach.  X-rays were also satisfactory.  I then took a high-speed bur and decorticated the transverse processes of L3 and L4 and the lateral aspect of the facet complex.  I then placed the rod and secured it and torqued down the locking nuts.  I then placed the remaining portion of the DBS Mix in the posterolateral gutter.  I then irrigated the wound copiously with normal saline, closed with 0 Vicryl, 2-0 Vicryl, and a 3-0 Monocryl.  Steri- Strips, dry dressings were applied to all the wounds.  The patient was extubated, transferred to the PACU without incident.  The first assistant was Norval Gable, my PA.  She was instrumental in assisting the was suction, irrigation, retraction as well as wound closure.     Alvy Beal, MD     DDB/MEDQ  D:  12/24/2010  T:  12/24/2010  Job:  [id number]  Electronically Signed by Venita Lick MD on 12/28/2010 11:03:30 AM

## 2011-01-14 NOTE — Discharge Summary (Deleted)
NAMETRICA, USERY NO.:  [id number]  MEDICAL RECORD NO.:  [id number]  LOCATION:  5028                         FACILITY:  [facility]  PHYSICIAN:  Alvy Beal, MD    DATE OF BIRTH:  [birth year]  DATE OF ADMISSION:  12/24/2010 DATE OF DISCHARGE:  12/25/2010                              DISCHARGE SUMMARY   ADMITTING DIAGNOSES: 1. Postlaminectomy syndrome with discogenic back pain, L3-4. 2. History of depression. 3. Heart murmur.  DISCHARGE DIAGNOSES: 1. Postlaminectomy syndrome with discogenic back pain, L3-4 status     post lateral interbody lumbar fusion L3-4. 2. History of depression 3. Heart murmur.  OPERATIVE PROCEDURE:  Lateral interbody fusion L3-4 using a DePuy carbon fiber cage.  Unilateral pedicle screw fixation and arthrodesis L3-4.  SURGEON:  Alvy Beal, MD  FIRST ASSISTANT:  Norval Gable, PA-C  COMPLICATIONS:  None.  CONSULTS:  None.  BRIEF HISTORY:  [name] is a pleasant 35 year old woman who about 7 months ago underwent an L3-4 diskectomy.  Her radicular leg pain had improved which she continued to have horrific disabling back pain.  Unfortunately conservative measures consisting of observation therapy, activity modification, chiropractic treatment, oral pain medications, physical therapy, and injection therapy had failed to alleviate her pain and given the ongoing horrific back, buttock, and left leg pain, she elected to proceed with surgery.  Risks, benefits of the procedure were discussed with the patient by Dr. Shon Baton.  MRI dated August 18, 2010 demonstrates L3-4 previous right laminotomy and microdiskectomy.  No recurrent disc extrusion.  HOSPITAL COURSE:  On December 24, 2010, the patient was admitted to [facility] and underwent the above-mentioned procedure without complication.  In stable condition, she was transferred to the PACU and subsequently to the orthopedic floor.  On postoperative day #1, the patient was  doing well.  She was urinating without difficulty.  There was positive flatus.  She was tolerating a regular diet.  Her pain was controlled with oral pain medications.  She was out of bed without difficulty.  She was alert and oriented x3.  No acute distress.  She was afebrile and her vital signs were stable.  The abdomen was soft and nontender.  Her neurovascular status in bilateral lower extremities were intact.  Her wound and dressings remained clean, dry, and intact.  Compartments were soft and nontender.  X-rays were reviewed by Dr. Shon Baton and were determined to be satisfactory.  She was subsequently cleared for discharge on December 25, 2010.  DISCHARGE MEDICATIONS: 1. MiraLax 17 g by mouth daily. 2. Percocet 10/325, 1 q.4-6 hours. 3. Pregabalin 75 mg by mouth twice daily. 4. Robaxin 500 mg 1 tablet by mouth q.8 hours. 5. Zofran 4 mg 1 tablet by mouth q.6 hours. 6. Flonase nasal spray, 2 sprays nasally daily. 7. Pristiq XR 50 mg 1 tablet by mouth daily. 8. Tri-Sprintec 1 tablet by mouth daily.   Preprinted discharge instructions were provided to the patient at the time of discharge.  Please see the patient discharge instruction sheet for the specifics regarding activity and followup instructions.  All of the patient's questions were encouraged, addressed, and answered at the time of discharge.  She would  follow up with Dr. Shon Baton in 2 weeks for wound check, suture removal, and further discussion of the expected recovery course.  The patient was discharged to home on December 25, 2010 in stable condition.    ______________________________ Norval Gable, PA   ______________________________ Alvy Beal, MD    DK/MEDQ  D:  01/14/2011  T:  01/14/2011  Job:  [id number]

## 2011-01-27 NOTE — Discharge Summary (Deleted)
NAMEBRETTANY, Holly NO.:  [id number]  MEDICAL RECORD NO.:  [id number]  LOCATION:  5028                         FACILITY:  [facility]  PHYSICIAN:  Alvy Beal, MD    DATE OF BIRTH:  Apr 10, 1975  DATE OF ADMISSION:  12/24/2010 DATE OF DISCHARGE:  12/25/2010                              DISCHARGE SUMMARY   ADMITTING DIAGNOSIS:  Postlaminectomy syndrome with discogenic back pain, L3 through L4.  DISCHARGE DIAGNOSIS:  Postlaminectomy syndrome with discogenic back pain, L3 through L4, status post fusion.  SURGEON:  Dahari D. Shon Baton, MD  FIRST ASSISTANT:  Norval Gable, PA-C  OPERATIVE PROCEDURE:  Lateral interbody fusion L3 through L4 using a DePuy carbon fiber cage with unilateral pedicle screw fixation and arthrodesis, L3 through L4.  COMPLICATIONS:  None.  CONSULTS:  None.  BRIEF HISTORY:  [name] is a pleasant 35 year old female who about 7 months to a year ago underwent a L3 through L4 diskectomy.  Her right leg pain improved, but she continued to have horrific, disabling back pain.  Unfortunately, conservative measures including activity modification, observation, physical therapy, injection therapy, and oral pain medication have failed to alleviate her symptoms and because of the ongoing pain and the decrease in her quality of life, she elected to proceed with surgery.  Risks, benefits, alternatives of surgery and expectations following surgery were discussed with the patient by Dr. Shon Baton.  Informed consent was obtained.  MRI dated August 18, 2010, demonstrates L3-4 previous right laminotomy and microdiskectomy.  No current recurrent disk herniation.  HOSPITAL COURSE:  On December 24, 2010, the patient was admitted to [facility] and underwent the above-stated procedure without complication.  For the details and specifics of the operative procedures, please see the operative report.  In stable condition, she was transferred to the PACU following  surgery and subsequently to the Orthopedic Floor.  On postop day #1, the patient was doing well.  She was tolerating a regular diet.  She was voiding on her own.  Had positive flatus.  She was up with physical therapy without difficulty.  She was afebrile with stable vital signs.  On clinical exam, she was alert and oriented x3.  No acute distress. The abdomen was soft and nontender.  Compartments were soft and nontender.  Her neurovascular status in the bilateral lower extremities was intact.  Her wound was clean, dry, and intact.  AP and lateral x- rays of the lumbar spine were reviewed by Dr. Shon Baton and were satisfactory.  After being cleared by Physical Therapy, she was discharged to home.  DISCHARGE MEDICATIONS: 1. MiraLax 17 g by mouth daily. 2. Percocet 3/325 1 tablet q.4-6 hours p.r.n. pain. 3. Pregabalin 75 mg 1 tablet twice daily. 4. Robaxin 500 mg 1 tablet by mouth q.8 hours p.r.n. spasm. 5. Zofran 4 mg 1 tablet by mouth q.6 hours p.r.n. nausea. 6. Flonase nasal spray, 2 sprays nasally daily. 7. Pristiq 50 mg 1 tablet by mouth daily. 8. Tri-Sprintec 1 tablet by mouth daily.  Preprinted discharge instructions were provided to the patient at the time of discharge.  These instructions include to increase activity slowly, no restrictions to her diet, stop smoking, and to  follow up with Dr. Shon Baton in 2 weeks following surgery.  The patient was instructed to call the office to make that appointment.  DISPOSITION:  Stable.    ______________________________ Norval Gable, PA   ______________________________ Alvy Beal, MD    DK/MEDQ  D:  01/27/2011  T:  01/27/2011  Job:  [id number]

## 2011-02-01 NOTE — Discharge Summary (Signed)
NAME:  [name], [name]               ACCOUNT NO.:  [id number]  MEDICAL RECORD NO.:  [id number]  LOCATION:  5028                         FACILITY:  [facility]  PHYSICIAN:  Dahari D Brooks, MD    DATE OF BIRTH:  03/29/1975  DATE OF ADMISSION:  12/24/2010 DATE OF DISCHARGE:  12/25/2010                              DISCHARGE SUMMARY   ADMITTING DIAGNOSIS:  Postlaminectomy syndrome with discogenic back pain, L3 through L4.  DISCHARGE DIAGNOSIS:  Postlaminectomy syndrome with discogenic back pain, L3 through L4, status post fusion.  SURGEON:  Dahari D. Brooks, MD  FIRST ASSISTANT:  Whittaker Lenis, PA-C  OPERATIVE PROCEDURE:  Lateral interbody fusion L3 through L4 using a DePuy carbon fiber cage with unilateral pedicle screw fixation and arthrodesis, L3 through L4.  COMPLICATIONS:  None.  CONSULTS:  None.  BRIEF HISTORY:  [name] is a pleasant 34-year-old female who about 7 months to a year ago underwent a L3 through L4 diskectomy.  Her right leg pain improved, but she continued to have horrific, disabling back pain.  Unfortunately, conservative measures including activity modification, observation, physical therapy, injection therapy, and oral pain medication have failed to alleviate her symptoms and because of the ongoing pain and the decrease in her quality of life, she elected to proceed with surgery.  Risks, benefits, alternatives of surgery and expectations following surgery were discussed with the patient by Dr. Brooks.  Informed consent was obtained.  MRI dated August 18, 2010, demonstrates L3-4 previous right laminotomy and microdiskectomy.  No current recurrent disk herniation.  HOSPITAL COURSE:  On December 24, 2010, the patient was admitted to [facility] and underwent the above-stated procedure without complication.  For the details and specifics of the operative procedures, please see the operative report.  In stable condition, she was transferred to the PACU following  surgery and subsequently to the Orthopedic Floor.  On postop day #1, the patient was doing well.  She was tolerating a regular diet.  She was voiding on her own.  Had positive flatus.  She was up with physical therapy without difficulty.  She was afebrile with stable vital signs.  On clinical exam, she was alert and oriented x3.  No acute distress. The abdomen was soft and nontender.  Compartments were soft and nontender.  Her neurovascular status in the bilateral lower extremities was intact.  Her wound was clean, dry, and intact.  AP and lateral x- rays of the lumbar spine were reviewed by Dr. Brooks and were satisfactory.  After being cleared by Physical Therapy, she was discharged to home.  DISCHARGE MEDICATIONS: 1. MiraLax 17 g by mouth daily. 2. Percocet 3/325 1 tablet q.4-6 hours p.r.n. pain. 3. Pregabalin 75 mg 1 tablet twice daily. 4. Robaxin 500 mg 1 tablet by mouth q.8 hours p.r.n. spasm. 5. Zofran 4 mg 1 tablet by mouth q.6 hours p.r.n. nausea. 6. Flonase nasal spray, 2 sprays nasally daily. 7. Pristiq 50 mg 1 tablet by mouth daily. 8. Tri-Sprintec 1 tablet by mouth daily.  Preprinted discharge instructions were provided to the patient at the time of discharge.  These instructions include to increase activity slowly, no restrictions to her diet, stop smoking, and to   follow up with Dr. Brooks in 2 weeks following surgery.  The patient was instructed to call the office to make that appointment.  DISPOSITION:  Stable.    ______________________________ Benney Sommerville, PA   ______________________________ Dahari D Brooks, MD    DK/MEDQ  D:  01/27/2011  T:  01/27/2011  Job:  [id number] 

## 2011-02-01 NOTE — Discharge Summary (Signed)
NAME:  [name], [name]               ACCOUNT NO.:  [id number]  MEDICAL RECORD NO.:  [id number]  LOCATION:  5028                         FACILITY:  [facility]  PHYSICIAN:  Dahari D Brooks, MD    DATE OF BIRTH:  10/17/1975  DATE OF ADMISSION:  12/24/2010 DATE OF DISCHARGE:  12/25/2010                              DISCHARGE SUMMARY   ADMITTING DIAGNOSES: 1. Postlaminectomy syndrome with discogenic back pain, L3-4. 2. History of depression. 3. Heart murmur.  DISCHARGE DIAGNOSES: 1. Postlaminectomy syndrome with discogenic back pain, L3-4 status     post lateral interbody lumbar fusion L3-4. 2. History of depression 3. Heart murmur.  OPERATIVE PROCEDURE:  Lateral interbody fusion L3-4 using a DePuy carbon fiber cage.  Unilateral pedicle screw fixation and arthrodesis L3-4.  SURGEON:  Dahari D Brooks, MD  FIRST ASSISTANT:  Gudrun Axe, PA-C  COMPLICATIONS:  None.  CONSULTS:  None.  BRIEF HISTORY:  [name] is a pleasant 34-year-old woman who about 7 months ago underwent an L3-4 diskectomy.  Her radicular leg pain had improved which she continued to have horrific disabling back pain.  Unfortunately conservative measures consisting of observation therapy, activity modification, chiropractic treatment, oral pain medications, physical therapy, and injection therapy had failed to alleviate her pain and given the ongoing horrific back, buttock, and left leg pain, she elected to proceed with surgery.  Risks, benefits of the procedure were discussed with the patient by Dr. Brooks.  MRI dated August 18, 2010 demonstrates L3-4 previous right laminotomy and microdiskectomy.  No recurrent disc extrusion.  HOSPITAL COURSE:  On December 24, 2010, the patient was admitted to [facility] and underwent the above-mentioned procedure without complication.  In stable condition, she was transferred to the PACU and subsequently to the orthopedic floor.  On postoperative day #1, the patient was  doing well.  She was urinating without difficulty.  There was positive flatus.  She was tolerating a regular diet.  Her pain was controlled with oral pain medications.  She was out of bed without difficulty.  She was alert and oriented x3.  No acute distress.  She was afebrile and her vital signs were stable.  The abdomen was soft and nontender.  Her neurovascular status in bilateral lower extremities were intact.  Her wound and dressings remained clean, dry, and intact.  Compartments were soft and nontender.  X-rays were reviewed by Dr. Brooks and were determined to be satisfactory.  She was subsequently cleared for discharge on December 25, 2010.  DISCHARGE MEDICATIONS: 1. MiraLax 17 g by mouth daily. 2. Percocet 10/325, 1 q.4-6 hours. 3. Pregabalin 75 mg by mouth twice daily. 4. Robaxin 500 mg 1 tablet by mouth q.8 hours. 5. Zofran 4 mg 1 tablet by mouth q.6 hours. 6. Flonase nasal spray, 2 sprays nasally daily. 7. Pristiq XR 50 mg 1 tablet by mouth daily. 8. Tri-Sprintec 1 tablet by mouth daily.   Preprinted discharge instructions were provided to the patient at the time of discharge.  Please see the patient discharge instruction sheet for the specifics regarding activity and followup instructions.  All of the patient's questions were encouraged, addressed, and answered at the time of discharge.  She would   follow up with Dr. Brooks in 2 weeks for wound check, suture removal, and further discussion of the expected recovery course.  The patient was discharged to home on December 25, 2010 in stable condition.    ______________________________ Gurnoor Sloop, PA   ______________________________ Dahari D Brooks, MD    DK/MEDQ  D:  01/14/2011  T:  01/14/2011  Job:  [id number] 

## 2011-06-24 ENCOUNTER — Encounter: Payer: Self-pay | Admitting: Cardiovascular Disease

## 2011-06-24 ENCOUNTER — Encounter: Payer: Self-pay | Admitting: *Deleted

## 2011-06-28 ENCOUNTER — Ambulatory Visit (INDEPENDENT_AMBULATORY_CARE_PROVIDER_SITE_OTHER): Payer: 59 | Admitting: Cardiovascular Disease

## 2011-06-28 ENCOUNTER — Encounter: Payer: Self-pay | Admitting: Cardiovascular Disease

## 2011-06-28 VITALS — BP 113/72 | HR 86 | Wt 214.0 lb

## 2011-06-28 DIAGNOSIS — Q210 Ventricular septal defect: Secondary | ICD-10-CM | POA: Insufficient documentation

## 2011-06-28 DIAGNOSIS — E041 Nontoxic single thyroid nodule: Secondary | ICD-10-CM

## 2011-06-28 NOTE — Assessment & Plan Note (Signed)
Not palpable to my exam  F/U TSH primary

## 2011-06-28 NOTE — Progress Notes (Signed)
Patient ID: [name], female   DOB: 11-28-1975, 36 y.o.   MRN: [id number] 36 yo last seen in 2009 for VSD.  Primary is [facility] in [place].  Mild exertional dyspnea that is chronic.  No SSCP, palpitations or syncope.  On no meds Actively trying to adopt "Faith" 39 week old girl that she had with her today.  Denies edema or sgns of pulmonary hypertension.  History of thyroid nodule followed by primary Echo 08/18/2007 reviewed   SUMMARY - The left ventricle was mildly dilated. Overall left ventricular systolic function was normal. Left ventricular ejection fraction was estimated to be 60 %. There were no left ventricular regional wall motion abnormalities. VSD velocity of 492 cm/sec and peak gradient of . There was a small, perimembranous ventricular septal defect. - The left atrium was mildly dilated. - Small peri-membranous VSD  IMPRESSIONS - Small peri-membranous VSD  ROS: Denies fever, malais, weight loss, blurry vision, decreased visual acuity, cough, sputum, SOB, hemoptysis, pleuritic pain, palpitaitons, heartburn, abdominal pain, melena, lower extremity edema, claudication, or rash.  All other systems reviewed and negative  General: Affect appropriate Healthy:  appears stated age HEENT: normal Neck supple with no adenopathy JVP normal no bruits no thyromegaly Lungs clear with no wheezing and good diaphragmatic motion Heart:  S1/S2 VSD  murmur, no rub, gallop or click PMI normal Abdomen: benighn, BS positve, no tenderness, no AAA no bruit.  No HSM or HJR Distal pulses intact with no bruits No edema Neuro non-focal Skin warm and dry No muscular weakness   No current outpatient prescriptions on file.    Allergies  Penicillins  Electrocardiogram:  NSR rate 86 normal ECG  Assessment and Plan

## 2011-06-28 NOTE — Patient Instructions (Signed)
Your physician has requested that you have an echocardiogram. Echocardiography is a painless test that uses sound waves to create images of your heart. It provides your doctor with information about the size and shape of your heart and how well your heart's chambers and valves are working. This procedure takes approximately one hour. There are no restrictions for this procedure.  Your physician wants you to follow-up in: 1 year with Dr. Nishan. You will receive a reminder letter in the mail two months in advance. If you don't receive a letter, please call our office to schedule the follow-up appointment.  

## 2011-06-28 NOTE — Assessment & Plan Note (Signed)
Previous restrictive perimembranous VSD.  No signs of RV failure.  Symptomatically ok and ECG with no right heart strain.  F/U echo

## 2011-07-02 ENCOUNTER — Ambulatory Visit (HOSPITAL_COMMUNITY): Payer: 59 | Attending: Endocrinology

## 2011-07-14 ENCOUNTER — Other Ambulatory Visit: Payer: Self-pay | Admitting: Obstetrics and Gynecology

## 2011-07-14 DIAGNOSIS — N644 Mastodynia: Secondary | ICD-10-CM

## 2011-07-19 ENCOUNTER — Ambulatory Visit
Admission: RE | Admit: 2011-07-19 | Discharge: 2011-07-19 | Disposition: A | Discharge status: Home / Self Care | Payer: 59 | Source: Ambulatory Visit | Attending: Obstetrics and Gynecology | Admitting: Obstetrics and Gynecology

## 2011-07-19 ENCOUNTER — Other Ambulatory Visit: Payer: Self-pay | Admitting: Obstetrics and Gynecology

## 2011-07-19 DIAGNOSIS — N644 Mastodynia: Secondary | ICD-10-CM

## 2011-12-15 ENCOUNTER — Other Ambulatory Visit: Payer: Self-pay | Admitting: Obstetrics and Gynecology

## 2011-12-15 DIAGNOSIS — R922 Inconclusive mammogram: Secondary | ICD-10-CM

## 2012-02-17 ENCOUNTER — Ambulatory Visit
Admission: RE | Admit: 2012-02-17 | Discharge: 2012-02-17 | Disposition: A | Discharge status: Home / Self Care | Payer: 59 | Source: Ambulatory Visit | Attending: Obstetrics and Gynecology | Admitting: Obstetrics and Gynecology

## 2012-02-17 DIAGNOSIS — R922 Inconclusive mammogram: Secondary | ICD-10-CM

## 2012-03-29 ENCOUNTER — Telehealth: Payer: Self-pay | Admitting: Cardiovascular Disease

## 2012-03-29 DIAGNOSIS — Q210 Ventricular septal defect: Secondary | ICD-10-CM

## 2012-03-29 NOTE — Telephone Encounter (Signed)
ORDER ENTERED ./CY 

## 2012-03-29 NOTE — Telephone Encounter (Signed)
New problem:   Need an order in the system for echo. Patient nos in April

## 2012-04-07 ENCOUNTER — Ambulatory Visit (HOSPITAL_COMMUNITY): Payer: BC Managed Care – PPO | Attending: Cardiology | Admitting: Radiology

## 2012-04-07 DIAGNOSIS — I510 Cardiac septal defect, acquired: Secondary | ICD-10-CM

## 2012-04-07 DIAGNOSIS — Q210 Ventricular septal defect: Secondary | ICD-10-CM

## 2012-04-07 NOTE — Progress Notes (Signed)
Echocardiogram performed.  

## 2012-12-22 DIAGNOSIS — H4710 Unspecified papilledema: Secondary | ICD-10-CM | POA: Insufficient documentation

## 2012-12-25 DIAGNOSIS — G932 Benign intracranial hypertension: Secondary | ICD-10-CM | POA: Insufficient documentation

## 2013-03-20 ENCOUNTER — Ambulatory Visit: Payer: Self-pay | Admitting: Podiatry

## 2013-03-23 ENCOUNTER — Ambulatory Visit (INDEPENDENT_AMBULATORY_CARE_PROVIDER_SITE_OTHER): Payer: BC Managed Care – PPO

## 2013-03-23 ENCOUNTER — Encounter: Payer: Self-pay | Admitting: Podiatry

## 2013-03-23 ENCOUNTER — Ambulatory Visit (INDEPENDENT_AMBULATORY_CARE_PROVIDER_SITE_OTHER): Payer: BC Managed Care – PPO | Admitting: Podiatry

## 2013-03-23 VITALS — BP 112/75 | HR 86 | Resp 16 | Ht 68.0 in | Wt 204.0 lb

## 2013-03-23 DIAGNOSIS — M722 Plantar fascial fibromatosis: Secondary | ICD-10-CM

## 2013-03-23 DIAGNOSIS — M79672 Pain in left foot: Secondary | ICD-10-CM

## 2013-03-23 DIAGNOSIS — M79609 Pain in unspecified limb: Secondary | ICD-10-CM

## 2013-03-23 NOTE — Progress Notes (Signed)
Subjective:     Patient ID: [name], female   DOB: 1975/11/10, 38 y.o.   MRN: [id number]  HPI patient presents stating that my right arch become sore after I been on it for a while and it feels like my left second MPJ at time seems to want to fall out of place   Review of Systems  All other systems reviewed and are negative.       Objective:   Physical Exam  Nursing note and vitals reviewed. Constitutional: She is oriented to person, place, and time.  Cardiovascular: Intact distal pulses.   Musculoskeletal: Normal range of motion.  Neurological: She is oriented to person, place, and time.  Skin: Skin is warm.   neurovascular status intact with discomfort in the arch of the right foot and no discomfort second MPJ left but feeling of instability     Assessment:     Very hard and a complete termination but may be some kind of inflammatory condition and I cannot rule out loosening of the implant second MPJ left    Plan:     X-rays reviewed and we will try a fascial brace for the right foot to lift the arch and evaluate 4 weeks

## 2013-04-20 ENCOUNTER — Encounter: Payer: BC Managed Care – PPO | Admitting: Podiatry

## 2013-07-06 ENCOUNTER — Encounter: Payer: Self-pay | Admitting: Podiatry

## 2013-07-06 ENCOUNTER — Ambulatory Visit (INDEPENDENT_AMBULATORY_CARE_PROVIDER_SITE_OTHER): Payer: BC Managed Care – PPO | Admitting: Podiatry

## 2013-07-06 ENCOUNTER — Ambulatory Visit (INDEPENDENT_AMBULATORY_CARE_PROVIDER_SITE_OTHER): Payer: BC Managed Care – PPO

## 2013-07-06 VITALS — BP 109/65 | HR 76 | Resp 16

## 2013-07-06 DIAGNOSIS — R52 Pain, unspecified: Secondary | ICD-10-CM

## 2013-07-06 DIAGNOSIS — M722 Plantar fascial fibromatosis: Secondary | ICD-10-CM

## 2013-07-08 NOTE — Progress Notes (Signed)
Subjective:     Patient ID: [name], female   DOB: March 18, 1975, 38 y.o.   MRN: [id number]  HPI patient states my right arch is bothering me and if i'm on my foot too much it can be frustrating   Review of Systems     Objective:   Physical Exam Neurovascular status is intact and muscle strength was adequate. Patient is found to have discomfort of a mild to moderate nature in the plantar arch right with no skin changes no increased edema calor or mottled appearance. Patient's heel is doing great with no pain    Assessment:     Plantar fasciitis which is just present in the mid arch area with previous surgery which is done and inability to use any kind of oral medications due to her health history    Plan:     Reviewed this with her and we will begin topical and she is to go back to stretching and night splint as much as possible. I do believe that eventually this will go away we also discussed types of activities that would be best for her and she will be seen if symptoms were to persist over the next several months

## 2014-03-08 HISTORY — PX: BRAIN SURGERY: SHX531

## 2014-05-09 ENCOUNTER — Encounter: Payer: Self-pay | Admitting: Cardiology

## 2014-05-09 ENCOUNTER — Ambulatory Visit (INDEPENDENT_AMBULATORY_CARE_PROVIDER_SITE_OTHER): Payer: BLUE CROSS/BLUE SHIELD | Admitting: Cardiology

## 2014-05-09 VITALS — BP 104/72 | HR 61 | Ht 68.0 in | Wt 224.6 lb

## 2014-05-09 DIAGNOSIS — Q210 Ventricular septal defect: Secondary | ICD-10-CM

## 2014-05-09 DIAGNOSIS — Z01810 Encounter for preprocedural cardiovascular examination: Secondary | ICD-10-CM | POA: Insufficient documentation

## 2014-05-09 NOTE — Patient Instructions (Signed)
Your physician has requested that you have an echocardiogram. Echocardiography is a painless test that uses sound waves to create images of your heart. It provides your doctor with information about the size and shape of your heart and how well your heart's chambers and valves are working. This procedure takes approximately one hour. There are no restrictions for this procedure.   Your physician wants you to follow-up in: 6 months with Dr. Johnsie Cancel or possibly sooner based on your echo results.  You will receive a reminder letter in the mail two months in advance. If you don't receive a letter, please call our office to schedule the follow-up appointment.

## 2014-05-09 NOTE — Progress Notes (Signed)
    05/09/2014 [name]   May 23, 1975  [id number]  Primary Physician Fae Pippin Primary Cardiologist: Dr Johnsie Cancel  HPI:  Unfortunate 39 y/o female who seems to have had more than her share of medical problems, seen by Dr Johnsie Cancel in the past (2009) for a VSD. Her LOV was April 2013, last echo Jan 2014 with results as noted below. 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 The pt denies any cardiac complaints. She does take antibiotics before dental work although she knows a VSD without shunt is not an indication, "just to be safe". She denies any syncope, near syncope, or unusual dyspnea. No palpitations or tachycardia.    Current Outpatient Prescriptions  Medication Sig Dispense Refill  . acetaZOLAMIDE (DIAMOX) 500 MG capsule Take 500 mg by mouth 2 (two) times daily.    [name] aspirin 81 MG tablet Take 81 mg by mouth daily.     No current facility-administered medications for this visit.    Allergies  Allergen Reactions  . Morphine Itching  . Penicillins Hives    History   Social History  . Marital Status: Married    Spouse Name: N/A  . Number of Children: N/A  . Years of Education: N/A   Occupational History  . Not on file.   Social History Main Topics  . Smoking status: Never Smoker   . Smokeless tobacco: Never Used  . Alcohol Use: No  . Drug Use: Not on file  . Sexual Activity: Not on file   Other Topics Concern  . Not on file   Social History Narrative     Review of Systems: General: negative for chills, fever, night sweats or weight changes.  Cardiovascular: negative for chest pain, dyspnea on exertion, edema, orthopnea, palpitations, paroxysmal nocturnal dyspnea or shortness of breath Dermatological: negative for rash Respiratory: negative for cough or wheezing Urologic: negative for hematuria Abdominal: negative for  nausea, vomiting, diarrhea, bright red blood per rectum, melena, or hematemesis Neurologic: negative for visual changes, syncope, or dizziness All other systems reviewed and are otherwise negative except as noted above.    Blood pressure 104/72, pulse 61, height 5\' 8"  (1.727 m), weight 224 lb 9.6 oz (101.878 kg).  General appearance: alert, cooperative, no distress and moderately obese Neck: no carotid bruit and no JVD Lungs: clear to auscultation bilaterally Heart: regular rate and rhythm and 2/6 mid systolic murmur LSB Abdomen: obese Extremities: no edema Pulses: 2+ and symmetric Skin: Skin color, texture, turgor normal. No rashes or lesions or no cyanosis Neurologic: Grossly normal  EKG NSR, no acute changes  ASSESSMENT AND PLAN:   Encounter for pre-operative cardiovascular clearance Pt to have "brain surgery" 05/21/14 for expanding meningioma.    VSD (ventricular septal defect) Small peri membranous VSD on echo Jan 2014    PLAN I ordered an echo to be done ASAP as its been > 2 years. If this is unchanged she should be an acceptable risk from a cardiovascular standpoint for surgery 05/21/14. Will review with Dr Johnsie Cancel.   Latoria Dry KPA-C 05/09/2014 3:12 PM

## 2014-05-09 NOTE — Assessment & Plan Note (Signed)
Small peri membranous VSD on echo Jan 2014

## 2014-05-09 NOTE — Assessment & Plan Note (Signed)
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

## 2014-05-09 NOTE — Progress Notes (Signed)
She should be ok for surgery anesthesia needs to be aware of VSD but unlikely that she would have a right to left shunt during anesthesia with small perimembranous VSD

## 2014-05-10 ENCOUNTER — Ambulatory Visit (HOSPITAL_COMMUNITY)
Admission: RE | Admit: 2014-05-10 | Discharge: 2014-05-10 | Disposition: A | Discharge status: Home / Self Care | Payer: BLUE CROSS/BLUE SHIELD | Source: Ambulatory Visit | Attending: Cardiology | Admitting: Cardiology

## 2014-05-10 DIAGNOSIS — Q210 Ventricular septal defect: Secondary | ICD-10-CM | POA: Diagnosis not present

## 2014-05-10 DIAGNOSIS — Z01810 Encounter for preprocedural cardiovascular examination: Secondary | ICD-10-CM

## 2014-05-10 NOTE — Progress Notes (Signed)
2D Echocardiogram Complete.  05/10/2014   Socorro Ebron, RDCS  

## 2014-05-16 ENCOUNTER — Telehealth: Payer: Self-pay | Admitting: Cardiovascular Disease

## 2014-05-16 NOTE — Telephone Encounter (Signed)
She has a small VSD with normal RV/LV function and no pulmonary hypertension. Should not be and issue with surgery OK to have Anesthesia should be aware of it though

## 2014-05-16 NOTE — Telephone Encounter (Signed)
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  She saw Kerin Ransom on 05/09/14 and had echo on 05/10/14. She is calling for echo results.

## 2014-05-16 NOTE — Telephone Encounter (Signed)
New Message  Pt wanting to discuss Echo results done 3/4 for surgical clearance. Please call back and discuss.

## 2014-05-17 NOTE — Telephone Encounter (Signed)
LM TO CALL BACK ./CY 

## 2014-05-17 NOTE — Telephone Encounter (Signed)
Re faxed  Phone  Note  To dr  Darnelle Catalan office  [facility] [place]  md  Not  [facility]   Fax number   Is   [phone] pt aware  May  Proceed with  Surgery ./cy

## 2014-05-17 NOTE — Telephone Encounter (Signed)
PHONE  NOTE  FAXED TO  DR  Darnelle Catalan OFFICE  CLEARING PT   FOR   PROCEDURE .Adonis Housekeeper

## 2014-05-17 NOTE — Telephone Encounter (Signed)
F/U       Pt returning call from today.   Please call back.

## 2014-05-28 ENCOUNTER — Ambulatory Visit: Payer: Self-pay | Admitting: Cardiology

## 2014-06-05 DIAGNOSIS — Z9889 Other specified postprocedural states: Secondary | ICD-10-CM | POA: Insufficient documentation

## 2014-06-14 ENCOUNTER — Ambulatory Visit (INDEPENDENT_AMBULATORY_CARE_PROVIDER_SITE_OTHER): Payer: BLUE CROSS/BLUE SHIELD | Admitting: Podiatry

## 2014-06-14 ENCOUNTER — Ambulatory Visit (INDEPENDENT_AMBULATORY_CARE_PROVIDER_SITE_OTHER): Payer: BLUE CROSS/BLUE SHIELD

## 2014-06-14 VITALS — BP 97/60 | HR 80 | Resp 16

## 2014-06-14 DIAGNOSIS — M722 Plantar fascial fibromatosis: Secondary | ICD-10-CM

## 2014-06-14 DIAGNOSIS — M79672 Pain in left foot: Secondary | ICD-10-CM | POA: Diagnosis not present

## 2014-06-16 NOTE — Progress Notes (Signed)
Subjective:     Patient ID: [name], female   DOB: 1976/02/18, 39 y.o.   MRN: [id number]  HPI patient presents stating I stop taking aspirin for a surgery on my brain and I developed acute plantar fasciitis of both heels.   Review of Systems     Objective:   Physical Exam Her vascular status intact with moderate depression of the arch noted in a patient who is had a poor health history and a history of inflammation. Moderate discomfort in the heels and into the arch of both feet with no areas of acute intense this    Assessment:     Plantar fasciitis bilateral with arch pain secondary to foot structure and probable systemic inflammatory condition    Plan:     I dispensed second night splint for usage along with ice and scanned for a new softer type orthotic and reviewed that we are trying to avoid injections or other aggressive type treatment which may be necessary. Reappoint to reevaluate again when orthotics returned and we'll use night splint aggressively until that time

## 2014-07-05 ENCOUNTER — Ambulatory Visit (INDEPENDENT_AMBULATORY_CARE_PROVIDER_SITE_OTHER): Payer: BLUE CROSS/BLUE SHIELD | Admitting: *Deleted

## 2014-07-05 DIAGNOSIS — M722 Plantar fascial fibromatosis: Secondary | ICD-10-CM

## 2014-07-05 NOTE — Progress Notes (Signed)
PT PRESENTS FOR ORTHOTIC PICKUP , WRITTEN AND VERBAL INSTRUCTIONS ARE GIVEN

## 2014-07-05 NOTE — Patient Instructions (Signed)

## 2014-10-05 ENCOUNTER — Emergency Department: Payer: BLUE CROSS/BLUE SHIELD

## 2014-10-05 ENCOUNTER — Emergency Department
Admission: EM | Admit: 2014-10-05 | Discharge: 2014-10-05 | Disposition: A | Discharge status: Home / Self Care | Payer: BLUE CROSS/BLUE SHIELD | Attending: Emergency Medicine | Admitting: Emergency Medicine

## 2014-10-05 DIAGNOSIS — Z3202 Encounter for pregnancy test, result negative: Secondary | ICD-10-CM | POA: Insufficient documentation

## 2014-10-05 DIAGNOSIS — Z880 Allergy status to penicillin: Secondary | ICD-10-CM | POA: Diagnosis not present

## 2014-10-05 DIAGNOSIS — R1030 Lower abdominal pain, unspecified: Secondary | ICD-10-CM | POA: Diagnosis present

## 2014-10-05 DIAGNOSIS — N8320 Unspecified ovarian cysts: Secondary | ICD-10-CM | POA: Diagnosis not present

## 2014-10-05 DIAGNOSIS — M549 Dorsalgia, unspecified: Secondary | ICD-10-CM | POA: Insufficient documentation

## 2014-10-05 DIAGNOSIS — G8929 Other chronic pain: Secondary | ICD-10-CM | POA: Diagnosis not present

## 2014-10-05 DIAGNOSIS — Z79899 Other long term (current) drug therapy: Secondary | ICD-10-CM | POA: Diagnosis not present

## 2014-10-05 DIAGNOSIS — F419 Anxiety disorder, unspecified: Secondary | ICD-10-CM | POA: Diagnosis not present

## 2014-10-05 DIAGNOSIS — N83202 Unspecified ovarian cyst, left side: Secondary | ICD-10-CM

## 2014-10-05 DIAGNOSIS — Z8543 Personal history of malignant neoplasm of ovary: Secondary | ICD-10-CM | POA: Insufficient documentation

## 2014-10-05 DIAGNOSIS — R102 Pelvic and perineal pain: Secondary | ICD-10-CM

## 2014-10-05 DIAGNOSIS — Z7982 Long term (current) use of aspirin: Secondary | ICD-10-CM | POA: Diagnosis not present

## 2014-10-05 LAB — POCT PREGNANCY, URINE: PREG TEST UR: NEGATIVE

## 2014-10-05 NOTE — ED Provider Notes (Signed)
[facility] Emergency Department Provider Note  ____________________________________________  Time seen: On arrival  I have reviewed the triage vital signs and the nursing notes.   HISTORY  Chief Complaint Abdominal Pain    HPI [name] is a 39 y.o. female who reports she's been having supra pubic pain for approximately 3 weeks. She has seen her PCP and her gynecologist has been checked for UTIs STDs and yeast infectionsall of which were negative. She saw her GYN within 1 week and had a pelvic exam which was benign. Patient reports aching pain suprapubically which is constant. She denies fevers chills. No nausea no vomiting. She reports a history of cancer and so she wants more tests done     Past Medical History  Diagnosis Date  . ADENOCARCINOMA, OVARY, RIGHT   . MENINGIOMA   . THYROID NODULE, RIGHT   . DEPRESSION   . ALLERGIC RHINITIS   . WEIGHT GAIN     Patient Active Problem List   Diagnosis Date Noted  . Encounter for pre-operative cardiovascular clearance 05/09/2014  . VSD (ventricular septal defect) 06/28/2011  . WEIGHT GAIN 05/23/2009  . ADENOCARCINOMA, OVARY, RIGHT 09/28/2007  . MENINGIOMA 09/28/2007  . THYROID NODULE, RIGHT 09/28/2007  . DEPRESSION 09/28/2007  . ALLERGIC RHINITIS 09/28/2007    Past Surgical History  Procedure Laterality Date  . Foot surgery Bilateral   . Brain surgery      2 stents/ tumor  . Angioplasty    . Spinal tap    . Oophorectomy Right     cancer  . Thyroid surgery Right   . Back surgery      x 2    Current Outpatient Rx  Name  Route  Sig  Dispense  Refill  . acetaZOLAMIDE (DIAMOX) 500 MG capsule   Oral   Take 500 mg by mouth 2 (two) times daily.         Marland Kitchen aspirin 81 MG tablet   Oral   Take 81 mg by mouth daily.         . baclofen (LIORESAL) 10 MG tablet            0   . HYDROcodone-acetaminophen (NORCO/VICODIN) 5-325 MG per tablet            0     Allergies Morphine  and Penicillins  Family History  Problem Relation Age of Onset  . Hypertension Father   . Diabetes Father   . Heart disease Maternal Grandmother     Social History History  Substance Use Topics  . Smoking status: Never Smoker   . Smokeless tobacco: Never Used  . Alcohol Use: No    Review of Systems  Constitutional: Negative for fever. Eyes: Negative for visual changes. ENT: Negative for sore throat Cardiovascular: Negative for chest pain. Respiratory: Negative for shortness of breath. Gastrointestinal: Positive for suprapubic pain Genitourinary: Negative for dysuria. Musculoskeletal: Chronic back pain Skin: Negative for rash. Neurological: Negative for headaches or focal weakness Psychiatric: Mild anxiety  10-point ROS otherwise negative.  ____________________________________________   PHYSICAL EXAM:  VITAL SIGNS: ED Triage Vitals  Enc Vitals Group     BP 10/05/14 0931 130/72 mmHg     Pulse Rate 10/05/14 0931 77     Resp 10/05/14 0931 18     Temp 10/05/14 0931 98.3 F (36.8 C)     Temp Source 10/05/14 0931 Oral     SpO2 10/05/14 0931 97 %     Weight 10/05/14 0931 220  lb (99.791 kg)     Height 10/05/14 0931 5\' 8"  (1.727 m)     Head Cir --      Peak Flow --      Pain Score 10/05/14 0934 8     Pain Loc --      Pain Edu? --      Excl. in [place]? --      Constitutional: Alert and oriented. Well appearing and in no distress. Eyes: Conjunctivae are normal.  ENT   Head: Normocephalic and atraumatic.   Mouth/Throat: Mucous membranes are moist.  Respiratory: Normal respiratory effort without tachypnea nor retractions. Gastrointestinal: Soft and non-tender in all quadrants. No distention. There is no CVA tenderness. Genitourinary: deferred, patient just had exam by her gynecologist Musculoskeletal: Nontender with normal range of motion in all extremities.  Neurologic:  Normal speech and language. No gross focal neurologic deficits are appreciated. Skin:   Skin is warm, dry and intact. No rash noted. Psychiatric: Mood and affect are normal. Patient exhibits appropriate insight and judgment.  ____________________________________________    LABS (pertinent positives/negatives)  Labs Reviewed - No data to display  ____________________________________________   EKG  None  ____________________________________________    RADIOLOGY I have personally reviewed any xrays that were ordered on this patient: Ultrasound shows left ovarian cyst  ____________________________________________   PROCEDURES  Procedure(s) performed: none  Critical Care performed: none  ____________________________________________   INITIAL IMPRESSION / ASSESSMENT AND PLAN / ED COURSE  Pertinent labs & imaging results that were available during my care of the patient were reviewed by me and considered in my medical decision making (see chart for details).  We will obtain ultrasound pelvis to evaluate.  I discussed ultrasound results with the patient notified her of the need for follow-up ultrasound in 6 weeks. Especially given her history. She is quite convinced that she has cancer and is upset with me for not doing more. She has excellent outpatient follow-up. I asked her to please follow up with her gynecologist for further workup.  ____________________________________________   FINAL CLINICAL IMPRESSION(S) / ED DIAGNOSES  Final diagnoses:  Pelvic pain in female  Cyst of left ovary     [name], MD 10/05/14 1335

## 2014-10-05 NOTE — Discharge Instructions (Signed)
Ovarian Cyst An ovarian cyst is a fluid-filled sac that forms on an ovary. The ovaries are small organs that produce eggs in women. Various types of cysts can form on the ovaries. Most are not cancerous. Many do not cause problems, and they often go away on their own. Some may cause symptoms and require treatment. Common types of ovarian cysts include:  Functional cysts--These cysts may occur every month during the menstrual cycle. This is normal. The cysts usually go away with the next menstrual cycle if the woman does not get pregnant. Usually, there are no symptoms with a functional cyst.  Endometrioma cysts--These cysts form from the tissue that lines the uterus. They are also called "chocolate cysts" because they become filled with blood that turns brown. This type of cyst can cause pain in the lower abdomen during intercourse and with your menstrual period.  Cystadenoma cysts--This type develops from the cells on the outside of the ovary. These cysts can get very big and cause lower abdomen pain and pain with intercourse. This type of cyst can twist on itself, cut off its blood supply, and cause severe pain. It can also easily rupture and cause a lot of pain.  Dermoid cysts--This type of cyst is sometimes found in both ovaries. These cysts may contain different kinds of body tissue, such as skin, teeth, hair, or cartilage. They usually do not cause symptoms unless they get very big.  Theca lutein cysts--These cysts occur when too much of a certain hormone (human chorionic gonadotropin) is produced and overstimulates the ovaries to produce an egg. This is most common after procedures used to assist with the conception of a baby (in vitro fertilization). CAUSES   Fertility drugs can cause a condition in which multiple large cysts are formed on the ovaries. This is called ovarian hyperstimulation syndrome.  A condition called polycystic ovary syndrome can cause hormonal imbalances that can lead to  nonfunctional ovarian cysts. SIGNS AND SYMPTOMS  Many ovarian cysts do not cause symptoms. If symptoms are present, they may include:  Pelvic pain or pressure.  Pain in the lower abdomen.  Pain during sexual intercourse.  Increasing girth (swelling) of the abdomen.  Abnormal menstrual periods.  Increasing pain with menstrual periods.  Stopping having menstrual periods without being pregnant. DIAGNOSIS  These cysts are commonly found during a routine or annual pelvic exam. Tests may be ordered to find out more about the cyst. These tests may include:  Ultrasound.  X-ray of the pelvis.  CT scan.  MRI.  Blood tests. TREATMENT  Many ovarian cysts go away on their own without treatment. Your health care provider may want to check your cyst regularly for 2-3 months to see if it changes. For women in menopause, it is particularly important to monitor a cyst closely because of the higher rate of ovarian cancer in menopausal women. When treatment is needed, it may include any of the following:  A procedure to drain the cyst (aspiration). This may be done using a long needle and ultrasound. It can also be done through a laparoscopic procedure. This involves using a thin, lighted tube with a tiny camera on the end (laparoscope) inserted through a small incision.  Surgery to remove the whole cyst. This may be done using laparoscopic surgery or an open surgery involving a larger incision in the lower abdomen.  Hormone treatment or birth control pills. These methods are sometimes used to help dissolve a cyst. HOME CARE INSTRUCTIONS   Only take over-the-counter   or prescription medicines as directed by your health care provider.  Follow up with your health care provider as directed.  Get regular pelvic exams and Pap tests. SEEK MEDICAL CARE IF:   Your periods are late, irregular, or painful, or they stop.  Your pelvic pain or abdominal pain does not go away.  Your abdomen becomes  larger or swollen.  You have pressure on your bladder or trouble emptying your bladder completely.  You have pain during sexual intercourse.  You have feelings of fullness, pressure, or discomfort in your stomach.  You lose weight for no apparent reason.  You feel generally ill.  You become constipated.  You lose your appetite.  You develop acne.  You have an increase in body and facial hair.  You are gaining weight, without changing your exercise and eating habits.  You think you are pregnant. SEEK IMMEDIATE MEDICAL CARE IF:   You have increasing abdominal pain.  You feel sick to your stomach (nauseous), and you throw up (vomit).  You develop a fever that comes on suddenly.  You have abdominal pain during a bowel movement.  Your menstrual periods become heavier than usual. MAKE SURE YOU:  Understand these instructions.  Will watch your condition.  Will get help right away if you are not doing well or get worse. Document Released: 02/22/2005 Document Revised: 02/27/2013 Document Reviewed: 10/30/2012 Exit[employer] Patient Information 2015 [employer]. This information is not intended to replace advice given to you by your health care provider. Make sure you discuss any questions you have with your health care provider.  

## 2014-10-05 NOTE — ED Notes (Signed)
[id number], C/O supra pubic pain described as the same feeling when she had her previous cancer. Patient describes a feeling of fullness and inability to empty her bladder despite complete urination.   Recent PCP/OBGYN ruled out UTI and STD

## 2014-10-05 NOTE — ED Notes (Signed)
Pt c/o suprapubic pain for the past 3 weeks and has been seen by PCP and ruled out UTI, STD or yeast infection but is concerned due to a hx of ovarian CA.Marland Kitchendenies N/V/D.Marland Kitchenstates she is having normal BM's

## 2015-01-01 ENCOUNTER — Ambulatory Visit (INDEPENDENT_AMBULATORY_CARE_PROVIDER_SITE_OTHER): Payer: BLUE CROSS/BLUE SHIELD | Admitting: Podiatry

## 2015-01-01 ENCOUNTER — Ambulatory Visit (INDEPENDENT_AMBULATORY_CARE_PROVIDER_SITE_OTHER): Payer: BLUE CROSS/BLUE SHIELD

## 2015-01-01 ENCOUNTER — Encounter: Payer: Self-pay | Admitting: Podiatry

## 2015-01-01 VITALS — BP 109/75 | HR 90 | Resp 16

## 2015-01-01 DIAGNOSIS — M79671 Pain in right foot: Secondary | ICD-10-CM

## 2015-01-01 DIAGNOSIS — M779 Enthesopathy, unspecified: Secondary | ICD-10-CM

## 2015-01-01 MED ORDER — TRIAMCINOLONE ACETONIDE 10 MG/ML IJ SUSP
10.0000 mg | Freq: Once | INTRAMUSCULAR | Status: AC
  Administered 2015-01-01: 10 mg

## 2015-01-01 NOTE — Progress Notes (Signed)
Subjective:     Patient ID: [name], female   DOB: 1975-12-05, 39 y.o.   MRN: [id number]  HPI patient states I'm having a lot of pain in the outside of my right foot which is been present for around 3 months and my heels are doing well   Review of Systems     Objective:   Physical Exam Neurovascular status intact with discomfort in the lateral side of the right peroneal tendon group that's localized with no indication of muscle strength loss    Assessment:     Tendinitis of the right lateral foot    Plan:     Careful sheath injection 3 mg Kenalog 5 mg Xylocaine and advised on physical therapy. Reappoint to recheck

## 2015-07-02 ENCOUNTER — Ambulatory Visit: Payer: Self-pay

## 2015-07-02 ENCOUNTER — Encounter: Payer: BLUE CROSS/BLUE SHIELD | Admitting: Podiatry

## 2015-07-02 DIAGNOSIS — M79675 Pain in left toe(s): Secondary | ICD-10-CM

## 2015-07-04 ENCOUNTER — Ambulatory Visit (INDEPENDENT_AMBULATORY_CARE_PROVIDER_SITE_OTHER): Payer: BLUE CROSS/BLUE SHIELD

## 2015-07-04 ENCOUNTER — Ambulatory Visit (INDEPENDENT_AMBULATORY_CARE_PROVIDER_SITE_OTHER): Payer: BLUE CROSS/BLUE SHIELD | Admitting: Podiatry

## 2015-07-04 ENCOUNTER — Ambulatory Visit: Payer: Self-pay

## 2015-07-04 DIAGNOSIS — M79672 Pain in left foot: Secondary | ICD-10-CM

## 2015-07-04 DIAGNOSIS — M779 Enthesopathy, unspecified: Secondary | ICD-10-CM

## 2015-07-04 MED ORDER — TRIAMCINOLONE ACETONIDE 10 MG/ML IJ SUSP
10.0000 mg | Freq: Once | INTRAMUSCULAR | Status: AC
  Administered 2015-07-04: 10 mg

## 2015-07-07 NOTE — Progress Notes (Signed)
Subjective:     Patient ID: [name], female   DOB: [birth year], 40 y.o.   MRN: [id number]  HPI patient presents with mild discomfort around the first MPJ left stating she's not sure if she might of bumped it or what might of occurred. Patient states the pain is worse when walking   Review of Systems     Objective:   Physical Exam Neurovascular status intact muscle strength adequate with inflammatory changes around the first MPJ left with fluid buildup and pain when palpated    Assessment:     Inflammatory capsulitis left    Plan:     Surgery itself seems to be doing okay but she may develop some mild inflammatory inflammation. I reviewed x-rays and then did careful injection around the joint surface 3 mg Dexon some Kenalog 5 mg Xylocaine and if symptoms persist we will reevaluate  X-ray report indicates that the osteotomy site looks good everything is intact with good alignment

## 2015-07-24 DIAGNOSIS — E669 Obesity, unspecified: Secondary | ICD-10-CM | POA: Insufficient documentation

## 2015-07-24 DIAGNOSIS — R0683 Snoring: Secondary | ICD-10-CM | POA: Insufficient documentation

## 2015-08-06 NOTE — Progress Notes (Signed)
This encounter was created in error - please disregard.

## 2016-06-02 ENCOUNTER — Other Ambulatory Visit (HOSPITAL_COMMUNITY): Payer: Self-pay | Admitting: Obstetrics and Gynecology

## 2016-06-02 DIAGNOSIS — N979 Female infertility, unspecified: Secondary | ICD-10-CM

## 2016-06-08 ENCOUNTER — Ambulatory Visit (HOSPITAL_COMMUNITY)
Admission: RE | Admit: 2016-06-08 | Discharge: 2016-06-08 | Disposition: A | Discharge status: Home / Self Care | Payer: BLUE CROSS/BLUE SHIELD | Source: Ambulatory Visit | Attending: Obstetrics and Gynecology | Admitting: Obstetrics and Gynecology

## 2016-06-08 DIAGNOSIS — N979 Female infertility, unspecified: Secondary | ICD-10-CM | POA: Diagnosis present

## 2016-06-08 DIAGNOSIS — Z9889 Other specified postprocedural states: Secondary | ICD-10-CM | POA: Diagnosis not present

## 2016-06-08 MED ORDER — IOPAMIDOL (ISOVUE-300) INJECTION 61%
30.0000 mL | Freq: Once | INTRAVENOUS | Status: AC | PRN
  Administered 2016-06-08: 30 mL

## 2016-06-28 ENCOUNTER — Ambulatory Visit (INDEPENDENT_AMBULATORY_CARE_PROVIDER_SITE_OTHER): Payer: BLUE CROSS/BLUE SHIELD | Admitting: Podiatry

## 2016-06-28 DIAGNOSIS — M722 Plantar fascial fibromatosis: Secondary | ICD-10-CM

## 2016-06-28 MED ORDER — TRIAMCINOLONE ACETONIDE 10 MG/ML IJ SUSP
10.0000 mg | Freq: Once | INTRAMUSCULAR | Status: AC
  Administered 2016-06-28: 10 mg

## 2016-06-28 NOTE — Progress Notes (Signed)
Subjective:    Patient ID: [name], female   DOB: 41 y.o.   MRN: [id number]   HPI patient presents stating I have developed a lot of pain in the bottom my left heel and it's worse when I get up in the morning and after periods of sitting    ROS      Objective:  Physical Exam Neurovascular status intact negative Homans sign was noted with patient found to have discomfort in the left plantar fascial lateral and central band that are very painful when palpated    Assessment:     Acute plantar fasciitis of the lateral central band of the heel    Plan:     Injected the left fascia from the lateral side 3 mg Kenalog 5 mg Xylocaine advised on ice therapy and supportive shoes and reappoint to recheck

## 2016-06-29 ENCOUNTER — Ambulatory Visit: Payer: BLUE CROSS/BLUE SHIELD | Admitting: Podiatry

## 2016-08-06 DIAGNOSIS — F5101 Primary insomnia: Secondary | ICD-10-CM | POA: Insufficient documentation

## 2016-08-06 DIAGNOSIS — F331 Major depressive disorder, recurrent, moderate: Secondary | ICD-10-CM | POA: Insufficient documentation

## 2016-11-11 ENCOUNTER — Encounter: Payer: Self-pay | Admitting: Podiatry

## 2016-11-11 ENCOUNTER — Other Ambulatory Visit: Payer: Self-pay | Admitting: Podiatry

## 2016-11-11 ENCOUNTER — Ambulatory Visit (INDEPENDENT_AMBULATORY_CARE_PROVIDER_SITE_OTHER): Payer: BLUE CROSS/BLUE SHIELD

## 2016-11-11 ENCOUNTER — Ambulatory Visit: Payer: BLUE CROSS/BLUE SHIELD

## 2016-11-11 ENCOUNTER — Ambulatory Visit (INDEPENDENT_AMBULATORY_CARE_PROVIDER_SITE_OTHER): Payer: BLUE CROSS/BLUE SHIELD | Admitting: Podiatry

## 2016-11-11 DIAGNOSIS — M722 Plantar fascial fibromatosis: Secondary | ICD-10-CM

## 2016-11-11 DIAGNOSIS — L723 Sebaceous cyst: Secondary | ICD-10-CM

## 2016-11-11 DIAGNOSIS — M79671 Pain in right foot: Secondary | ICD-10-CM

## 2016-11-11 MED ORDER — TRIAMCINOLONE ACETONIDE 10 MG/ML IJ SUSP
10.0000 mg | Freq: Once | INTRAMUSCULAR | Status: AC
  Administered 2016-11-11: 10 mg

## 2016-11-11 NOTE — Progress Notes (Signed)
Subjective:    Patient ID: [name], female   DOB: 41 y.o.   MRN: [id number]   HPI patient presents with exquisite discomfort plantar aspect of the left heel lateral side and nodule in the right second metatarsal that she's concerned about    ROS      Objective:  Physical Exam neurovascular status unchanged with exquisite discomfort plantar lateral aspect left plantar fascia and small nodule measuring about 4 x 4 mm in the proximal portion of previous incision right     Assessment:   Acute plantar fasciitis left with possibility for calcified nodule or other pathology right forefoot     Plan:   H&P x-rays reviewed and injected the lateral plantar fascia 3 Milligan Kenalog 5 mg Xylocaine and advised on physical therapy continued orthotic usage. Right foot we will just use heat therapy and will monitor  X-rays indicate that there is negative signs stress fracture plantar heel left and the implant is in place bilateral second MPJ

## 2016-12-08 ENCOUNTER — Ambulatory Visit: Payer: BLUE CROSS/BLUE SHIELD | Admitting: Podiatry

## 2017-01-06 ENCOUNTER — Ambulatory Visit (INDEPENDENT_AMBULATORY_CARE_PROVIDER_SITE_OTHER): Payer: BLUE CROSS/BLUE SHIELD | Admitting: Podiatry

## 2017-01-06 ENCOUNTER — Ambulatory Visit (INDEPENDENT_AMBULATORY_CARE_PROVIDER_SITE_OTHER): Payer: BLUE CROSS/BLUE SHIELD

## 2017-01-06 DIAGNOSIS — M7751 Other enthesopathy of right foot: Secondary | ICD-10-CM | POA: Diagnosis not present

## 2017-01-06 DIAGNOSIS — R52 Pain, unspecified: Secondary | ICD-10-CM | POA: Diagnosis not present

## 2017-01-06 DIAGNOSIS — M779 Enthesopathy, unspecified: Secondary | ICD-10-CM

## 2017-01-06 NOTE — Progress Notes (Addendum)
This patient presents the office with severe pain noted at the site of her previous surgery in her right forefoot.  She says she had an implant put into her right forefoot by Dr. Paulla Dolly previously.  She says this area has become painful and swollen as she walks.  She says this pain has gotten very severe and she is having difficulty walking without limping.  She presents the office today for an evaluation of this surgical site   Neurovascular status is unchanged  . There is swelling and inflammation noted on the dorsum of the second MPJ of the right foot.  Palpable pain noted at the proximal aspect of the second toe.  There is also pain noted in the area of the implant second MPJ.  No redness or streaking noted from the painful site of her right forefoot.   Capsulitis 2MPJ right  ROV  Injection therapy using 1.0 cc. Of 2% xylocaine( 20 mg.) plus 1 cc. of kenalog-la ( 10 mg) plus 1/2 cc. of dexamethazone phosphate ( 2 mg).. Patient was told to use a surgical shoe the next few days until the pain and inflammation subsides.   Gardiner Barefoot DPM

## 2017-01-11 ENCOUNTER — Other Ambulatory Visit: Payer: Self-pay | Admitting: Podiatry

## 2017-01-11 DIAGNOSIS — M779 Enthesopathy, unspecified: Secondary | ICD-10-CM

## 2017-03-10 ENCOUNTER — Ambulatory Visit: Payer: BLUE CROSS/BLUE SHIELD | Admitting: Podiatry

## 2017-04-28 DIAGNOSIS — M5136 Other intervertebral disc degeneration, lumbar region: Secondary | ICD-10-CM | POA: Insufficient documentation

## 2017-05-24 DIAGNOSIS — M5020 Other cervical disc displacement, unspecified cervical region: Secondary | ICD-10-CM | POA: Insufficient documentation

## 2017-05-27 ENCOUNTER — Ambulatory Visit: Payer: BLUE CROSS/BLUE SHIELD

## 2017-05-27 ENCOUNTER — Encounter: Payer: Self-pay | Admitting: Podiatry

## 2017-05-27 ENCOUNTER — Ambulatory Visit: Payer: BLUE CROSS/BLUE SHIELD | Admitting: Podiatry

## 2017-05-27 DIAGNOSIS — M722 Plantar fascial fibromatosis: Secondary | ICD-10-CM

## 2017-05-27 MED ORDER — TRIAMCINOLONE ACETONIDE 10 MG/ML IJ SUSP
10.0000 mg | Freq: Once | INTRAMUSCULAR | Status: AC
  Administered 2017-05-27: 10 mg

## 2017-06-01 NOTE — Progress Notes (Signed)
Subjective:   Patient ID: [name], female   DOB: 42 y.o.   MRN: [id number]   HPI Patient presents stating that the left heel has been bothering her quite a bit and that she was doing real well and it has flared up again   ROS      Objective:  Physical Exam  Neurovascular status intact with inflammation pain around the plantar fascial left     Assessment:  Plantar fasciitis left with inflammation fluid around the medial band     Plan:  We injected the plantar fascial left 3 mg Kenalog 5 mg Xylocaine and instructed for physical therapy shoe gear modifications

## 2017-08-23 DIAGNOSIS — Z981 Arthrodesis status: Secondary | ICD-10-CM | POA: Insufficient documentation

## 2017-09-15 ENCOUNTER — Encounter: Payer: Self-pay | Admitting: Podiatry

## 2017-09-15 ENCOUNTER — Ambulatory Visit: Payer: BLUE CROSS/BLUE SHIELD | Admitting: Podiatry

## 2017-09-15 ENCOUNTER — Ambulatory Visit (INDEPENDENT_AMBULATORY_CARE_PROVIDER_SITE_OTHER): Payer: BLUE CROSS/BLUE SHIELD

## 2017-09-15 ENCOUNTER — Other Ambulatory Visit: Payer: Self-pay | Admitting: Podiatry

## 2017-09-15 DIAGNOSIS — M79672 Pain in left foot: Secondary | ICD-10-CM | POA: Diagnosis not present

## 2017-09-15 DIAGNOSIS — M84375A Stress fracture, left foot, initial encounter for fracture: Secondary | ICD-10-CM

## 2017-09-15 MED ORDER — HYDROCODONE-ACETAMINOPHEN 10-325 MG PO TABS: 1.0000 | ORAL_TABLET | Freq: Four times a day (QID) | ORAL | 0 refills | Status: AC | PRN

## 2017-09-15 NOTE — Progress Notes (Signed)
Subjective:   Patient ID: [name], female   DOB: 42 y.o.   MRN: [id number]   HPI Patient states left foot has really been hurting and she does not remember specific injury but she is having trouble walking on   ROS      Objective:  Physical Exam  Neurovascular status intact with exquisite discomfort around the fourth metatarsal distal shaft left with swelling with no discomfort currently in the joint or plantar fascia     Assessment:  Strong probability for stress fracture of the distal shaft fourth metatarsal left     Plan:  H&P x-ray reviewed and recommended boot usage which patient has at home.  Will utilize ice therapy and I did write for hydrocodone No. 20 to be taken at night due to throbbing and patient will be seen back again in 2 weeks to review new x-rays and decide about the long-term of this condition  X-ray was inconclusive as to there is a stress fracture of the fourth metatarsal left.  Clinically seems to indicate

## 2017-09-23 ENCOUNTER — Telehealth: Payer: Self-pay | Admitting: Podiatry

## 2017-09-23 NOTE — Telephone Encounter (Signed)
Pt states she took the norco but then she slept a day and a half, but did not help the pain, neither did the oxycodone she had. I told pt to stay in the boot when walking and change the tension on the straps and ice therapy, 1/2 norco and if able to take ibuprofen OTC take as package instructs in between the doses of the norco and keep appt for Thursday. I told pt not to take the oxycodone.

## 2017-09-23 NOTE — Telephone Encounter (Signed)
Left foot is still painful. Pain med is not helping at all, pt. Is scheduled for Thursday.

## 2017-09-29 ENCOUNTER — Ambulatory Visit: Payer: BLUE CROSS/BLUE SHIELD | Admitting: Podiatry

## 2017-09-29 ENCOUNTER — Ambulatory Visit (INDEPENDENT_AMBULATORY_CARE_PROVIDER_SITE_OTHER): Payer: BLUE CROSS/BLUE SHIELD

## 2017-09-29 ENCOUNTER — Encounter: Payer: Self-pay | Admitting: Podiatry

## 2017-09-29 DIAGNOSIS — R600 Localized edema: Secondary | ICD-10-CM | POA: Diagnosis not present

## 2017-09-29 DIAGNOSIS — R109 Unspecified abdominal pain: Secondary | ICD-10-CM | POA: Insufficient documentation

## 2017-09-29 DIAGNOSIS — M84375D Stress fracture, left foot, subsequent encounter for fracture with routine healing: Secondary | ICD-10-CM | POA: Diagnosis not present

## 2017-09-29 DIAGNOSIS — B373 Candidiasis of vulva and vagina: Secondary | ICD-10-CM | POA: Insufficient documentation

## 2017-09-29 DIAGNOSIS — N979 Female infertility, unspecified: Secondary | ICD-10-CM | POA: Insufficient documentation

## 2017-09-29 DIAGNOSIS — N941 Unspecified dyspareunia: Secondary | ICD-10-CM | POA: Insufficient documentation

## 2017-09-29 DIAGNOSIS — B3731 Acute candidiasis of vulva and vagina: Secondary | ICD-10-CM | POA: Insufficient documentation

## 2017-09-29 DIAGNOSIS — L900 Lichen sclerosus et atrophicus: Secondary | ICD-10-CM | POA: Insufficient documentation

## 2017-09-29 DIAGNOSIS — B009 Herpesviral infection, unspecified: Secondary | ICD-10-CM | POA: Insufficient documentation

## 2017-09-29 NOTE — Progress Notes (Signed)
Subjective:   Patient ID: [name], female   DOB: 42 y.o.   MRN: [id number]   HPI Patient presents stating that she is still having quite a bit of pain and swelling in her left foot and wearing the boot.  States the swelling is really bothering her   ROS      Objective:  Physical Exam  Neurovascular status intact with patient found to have inflammation pain of the left dorsal foot with fluid buildup and exquisite discomfort of the third metatarsal distal shaft     Assessment:  Stress fracture of the left third metatarsal     Plan:  H&P condition reviewed and x-ray reviewed.  Today I applied an Unna boot to reduce the swelling advised on continued boot usage gradual increase in activities but that this is going to take quite a bit longer heal completely  X-ray indicates that there is a fracture of the distal third metatarsal left that is gradually healing but has a long way to go

## 2017-10-31 ENCOUNTER — Ambulatory Visit: Payer: BLUE CROSS/BLUE SHIELD | Admitting: Podiatry

## 2017-10-31 ENCOUNTER — Encounter: Payer: Self-pay | Admitting: Podiatry

## 2017-10-31 ENCOUNTER — Ambulatory Visit (INDEPENDENT_AMBULATORY_CARE_PROVIDER_SITE_OTHER): Payer: BLUE CROSS/BLUE SHIELD

## 2017-10-31 DIAGNOSIS — M84375D Stress fracture, left foot, subsequent encounter for fracture with routine healing: Secondary | ICD-10-CM | POA: Diagnosis not present

## 2017-11-01 NOTE — Progress Notes (Signed)
Subjective:   Patient ID: [name], female   DOB: 42 y.o.   MRN: [id number]   HPI Patient states that she is improving with discomfort that has moderated quite a bit.  Patient states she still gets swelling if she is on it too long   ROS      Objective:  Physical Exam  Neurovascular status intact with patient's left forefoot healing well with discomfort in the third metatarsal shaft but continues to reduce     Assessment:  Doing much better from having had fracture of the third metatarsal left foot     Plan:  Reviewed condition and discussed the healing of the fracture that continues to occur and advised this patient on continued being careful with this but gradual increase in activity levels and it should heal uneventfully.  Reappoint if symptoms indicate  X-rays indicate fracture third metatarsal left is healing well

## 2018-09-12 ENCOUNTER — Telehealth: Payer: Self-pay | Admitting: *Deleted

## 2018-09-12 NOTE — Telephone Encounter (Signed)
Pt requested the dates of her last 3 surgeries, 1 in 2012. I called pt and told her I did not see surgeries within the time frame our office had begun Epic charting. Pt states she will call the surgery center.

## 2019-05-29 ENCOUNTER — Ambulatory Visit
Admission: RE | Admit: 2019-05-29 | Discharge: 2019-05-29 | Disposition: A | Discharge status: Home / Self Care | Payer: Self-pay | Source: Ambulatory Visit | Attending: Chiropractor | Admitting: Chiropractor

## 2019-05-29 ENCOUNTER — Other Ambulatory Visit: Payer: Self-pay | Admitting: Chiropractor

## 2019-05-29 DIAGNOSIS — S233XXA Sprain of ligaments of thoracic spine, initial encounter: Secondary | ICD-10-CM

## 2019-05-29 DIAGNOSIS — S134XXA Sprain of ligaments of cervical spine, initial encounter: Secondary | ICD-10-CM | POA: Insufficient documentation

## 2019-05-29 DIAGNOSIS — S338XXA Sprain of other parts of lumbar spine and pelvis, initial encounter: Secondary | ICD-10-CM

## 2020-05-06 HISTORY — PX: LAPAROSCOPIC GASTRIC BYPASS: SUR771

## 2020-12-10 ENCOUNTER — Other Ambulatory Visit: Payer: Self-pay | Admitting: Podiatry

## 2020-12-24 ENCOUNTER — Emergency Department
Admission: EM | Admit: 2020-12-24 | Discharge: 2020-12-24 | Disposition: A | Discharge status: Home / Self Care | Payer: BLUE CROSS/BLUE SHIELD | Attending: Emergency Medicine | Admitting: Emergency Medicine

## 2020-12-24 DIAGNOSIS — R519 Headache, unspecified: Secondary | ICD-10-CM | POA: Diagnosis not present

## 2020-12-24 DIAGNOSIS — T7840XA Allergy, unspecified, initial encounter: Secondary | ICD-10-CM | POA: Diagnosis not present

## 2020-12-24 DIAGNOSIS — Z5321 Procedure and treatment not carried out due to patient leaving prior to being seen by health care provider: Secondary | ICD-10-CM | POA: Insufficient documentation

## 2020-12-24 NOTE — ED Triage Notes (Signed)
EMS brings pt in from home; st gabapentin taken tonight for c/o HA and "made her feel very weird"; hx craniotomy

## 2020-12-24 NOTE — ED Notes (Signed)
Pt reports feeling better now and doesn't want to be seen

## 2020-12-29 ENCOUNTER — Encounter: Payer: Self-pay | Admitting: Podiatry

## 2020-12-29 ENCOUNTER — Encounter
Admission: RE | Admit: 2020-12-29 | Discharge: 2020-12-29 | Disposition: A | Discharge status: Home / Self Care | Payer: BLUE CROSS/BLUE SHIELD | Source: Ambulatory Visit | Attending: Podiatry | Admitting: Podiatry

## 2020-12-29 ENCOUNTER — Other Ambulatory Visit: Payer: Self-pay

## 2020-12-29 HISTORY — DX: Idiopathic aseptic necrosis of unspecified bone: M87.00

## 2020-12-29 HISTORY — DX: Sleep apnea, unspecified: G47.30

## 2020-12-29 HISTORY — DX: Other intervertebral disc degeneration, lumbar region without mention of lumbar back pain or lower extremity pain: M51.369

## 2020-12-29 HISTORY — DX: Gastro-esophageal reflux disease without esophagitis: K21.9

## 2020-12-29 HISTORY — DX: Lichen sclerosus et atrophicus: L90.0

## 2020-12-29 HISTORY — DX: Headache, unspecified: R51.9

## 2020-12-29 HISTORY — DX: Pure hypercholesterolemia, unspecified: E78.00

## 2020-12-29 HISTORY — DX: Other intervertebral disc degeneration, lumbar region: M51.36

## 2020-12-29 HISTORY — DX: Ventricular septal defect: Q21.0

## 2020-12-29 HISTORY — DX: Obesity, unspecified: E66.9

## 2020-12-29 NOTE — Patient Instructions (Signed)
Your procedure is scheduled on:01-09-21 Friday Report to the Registration Desk on the 1st floor of the [facility].Then proceed to the 2nd floor Surgery Desk in the [facility] To find out your arrival time, please call [phone] between 1PM - 3PM on:01-08-21 Thursday  REMEMBER: Instructions that are not followed completely may result in serious medical risk, up to and including death; or upon the discretion of your surgeon and anesthesiologist your surgery may need to be rescheduled.  Do not eat food after midnight the night before surgery.  No gum chewing, lozengers or hard candies.  You may however, drink CLEAR liquids up to 2 hours before you are scheduled to arrive for your surgery. Do not drink anything within 2 hours of your scheduled arrival time.  Clear liquids include: - water  - apple juice without pulp - gatorade (not RED, PURPLE, OR BLUE) - black coffee or tea (Do NOT add milk or creamers to the coffee or tea) Do NOT drink anything that is not on this list  In addition, your doctor has ordered for you to drink the provided  Ensure Pre-Surgery Clear Carbohydrate Drink  Drinking this carbohydrate drink up to two hours before surgery helps to reduce insulin resistance and improve patient outcomes. Please complete drinking 2 hours prior to scheduled arrival time.  Do not take any medication the day of surgery  As instructed by Dr Karna Christmas office, stop your 81 mg Aspirin 7 days prior to surgery-Last dose on 01-01-21 (Thursday)  One week prior to surgery: Stop Anti-inflammatories (NSAIDS) such as Advil, Aleve, Ibuprofen, Motrin, Naproxen, Naprosyn and Aspirin based products such as Excedrin, Goodys Powder, BC Powder.You may however, take Tylenol if needed for pain up until the day of surgery.  Stop ANY OVER THE COUNTER supplements/vitamins 7 days prior to surgery  No Alcohol for 24 hours before or after surgery.  No Smoking including e-cigarettes for 24 hours prior to  surgery.  No chewable tobacco products for at least 6 hours prior to surgery.  No nicotine patches on the day of surgery.  Do not use any "recreational" drugs for at least a week prior to your surgery.  Please be advised that the combination of cocaine and anesthesia may have negative outcomes, up to and including death. If you test positive for cocaine, your surgery will be cancelled.  On the morning of surgery brush your teeth with toothpaste and water, you may rinse your mouth with mouthwash if you wish. Do not swallow any toothpaste or mouthwash.  Use CHG Soap as directed on instruction sheet.  Do not wear jewelry, make-up, hairpins, clips or nail polish.  Do not wear lotions, powders, or perfumes.   Do not shave body from the neck down 48 hours prior to surgery just in case you cut yourself which could leave a site for infection.  Also, freshly shaved skin may become irritated if using the CHG soap.  Contact lenses, hearing aids and dentures may not be worn into surgery.  Do not bring valuables to the hospital. [facility] is not responsible for any missing/lost belongings or valuables.   Notify your doctor if there is any change in your medical condition (cold, fever, infection).  Wear comfortable clothing (specific to your surgery type) to the hospital.  After surgery, you can help prevent lung complications by doing breathing exercises.  Take deep breaths and cough every 1-2 hours. Your doctor may order a device called an Incentive Spirometer to help you take deep breaths.  When coughing or sneezing, hold a pillow firmly against your incision with both hands. This is called "splinting." Doing this helps protect your incision. It also decreases belly discomfort.  If you are being admitted to the hospital overnight, leave your suitcase in the car. After surgery it may be brought to your room.  If you are being discharged the day of surgery, you will not be allowed to drive  home. You will need a responsible adult (18 years or older) to drive you home and stay with you that night.   If you are taking public transportation, you will need to have a responsible adult (18 years or older) with you. Please confirm with your physician that it is acceptable to use public transportation.   Please call the [facility] at [phone] if you have any questions about these instructions.  Surgery Visitation Policy:  Patients undergoing a surgery or procedure may have one family member or support person with them as long as that person is not COVID-19 positive or experiencing its symptoms.  That person may remain in the waiting area during the procedure and may rotate out with other people.  Inpatient Visitation:    Visiting hours are 7 a.m. to 8 p.m. Up to two visitors ages 16+ are allowed at one time in a patient room. The visitors may rotate out with other people during the day. Visitors must check out when they leave, or other visitors will not be allowed. One designated support person may remain overnight. The visitor must pass COVID-19 screenings, use hand sanitizer when entering and exiting the patient's room and wear a mask at all times, including in the patient's room. Patients must also wear a mask when staff or their visitor are in the room. Masking is required regardless of vaccination status.

## 2020-12-31 ENCOUNTER — Inpatient Hospital Stay
Admission: RE | Admit: 2020-12-31 | Discharge: 2020-12-31 | Disposition: A | Discharge status: Home / Self Care | Payer: BLUE CROSS/BLUE SHIELD | Source: Ambulatory Visit

## 2020-12-31 HISTORY — DX: Benign intracranial hypertension: G93.2

## 2020-12-31 NOTE — Pre-Procedure Instructions (Signed)
Patient did not come to her lab appointment today for her EKG, she reports to this writer that she is not sure that she is having her surgery due to her insurance , she reports that her insurance company may not cover her procedure.

## 2021-01-05 ENCOUNTER — Encounter: Payer: Self-pay | Admitting: Podiatry

## 2021-01-06 ENCOUNTER — Other Ambulatory Visit: Payer: Self-pay

## 2021-01-06 ENCOUNTER — Other Ambulatory Visit
Admission: RE | Admit: 2021-01-06 | Discharge: 2021-01-06 | Disposition: A | Discharge status: Home / Self Care | Payer: BLUE CROSS/BLUE SHIELD | Source: Ambulatory Visit | Attending: Podiatry | Admitting: Podiatry

## 2021-01-06 DIAGNOSIS — X58XXXA Exposure to other specified factors, initial encounter: Secondary | ICD-10-CM | POA: Diagnosis not present

## 2021-01-06 DIAGNOSIS — Z01810 Encounter for preprocedural cardiovascular examination: Secondary | ICD-10-CM | POA: Insufficient documentation

## 2021-01-06 DIAGNOSIS — E669 Obesity, unspecified: Secondary | ICD-10-CM | POA: Diagnosis not present

## 2021-01-06 DIAGNOSIS — T8484XA Pain due to internal orthopedic prosthetic devices, implants and grafts, initial encounter: Secondary | ICD-10-CM | POA: Diagnosis present

## 2021-01-06 DIAGNOSIS — Z6826 Body mass index (BMI) 26.0-26.9, adult: Secondary | ICD-10-CM | POA: Diagnosis not present

## 2021-01-06 DIAGNOSIS — E7800 Pure hypercholesterolemia, unspecified: Secondary | ICD-10-CM | POA: Diagnosis not present

## 2021-01-06 DIAGNOSIS — K219 Gastro-esophageal reflux disease without esophagitis: Secondary | ICD-10-CM | POA: Diagnosis not present

## 2021-01-06 DIAGNOSIS — Q210 Ventricular septal defect: Secondary | ICD-10-CM | POA: Insufficient documentation

## 2021-01-06 DIAGNOSIS — G4730 Sleep apnea, unspecified: Secondary | ICD-10-CM | POA: Diagnosis not present

## 2021-01-06 NOTE — Progress Notes (Addendum)
Perioperative Services Pre-Admission/Anesthesia Testing   Date: 01/06/21 Name: [name] MRN:   [id number]  Re: Consideration of preoperative prophylactic antibiotic change   Request sent to: [name], DPM (routed and/or faxed via [facility])  Planned Surgical Procedure(s):    Case: [id number] Date/Time: 01/09/21 0715   Procedure: HARDWARE REMOVAL (Bilateral)   Anesthesia type: Choice   Pre-op diagnosis: T85.9XXD- Complication of impanted device   Location: [facility] OR ROOM 01 / [place] ORS FOR ANESTHESIA GROUP   Surgeons: [name], DPM   Clinical Notes:  Patient has a documented allergy to PCN  Advising that PCN has caused her to experience urticarial rash in the past.   Received cephalosporin with no documented complications AMOXICILLIN - took some of husbands left over medication on 04/12/2017 without ADR.  CEFUROXIME received on 06/12/2013 with no documented ADRs.  Screened as appropriate for cephalosporin use during medication reconciliation No immediate angioedema, dysphagia, SOB, anaphylaxis symptoms. No severe rash involving mucous membranes or skin necrosis. No hospital admissions related to side effects of PCN/cephalosporin use.  No documented reaction to PCN or cephalosporin in the last 10 years.  Request:  As an evidence based approach to reducing the rate of incidence for post-operative SSI and the development of MDROs, could an agent with narrower coverage for preoperative prophylaxis in this patient's upcoming surgical course be considered?   Currently ordered preoperative prophylactic ABX: clindamycin.   Specifically requesting change to cephalosporin (CEFAZOLIN).   Please communicate decision with me and I will change the orders in Epic as per your direction.   Things to consider: Many patients report that they were "allergic" to PCN earlier in life, however this does not translate into a true lifelong allergy. Patients can lose sensitivity to specific  IgE antibodies over time if PCN is avoided (Kleris & Lugar, 2019).  Up to 10% of the adult population and 15% of hospitalized patients report an allergy to PCN, however clinical studies suggest that 90% of those reporting an allergy can tolerate PCN antibiotics (Kleris & Lugar, 2019).  Cross-sensitivity between PCN and cephalosporins has been documented as being as high as 10%, however this estimation included data believed to have been collected in a setting where there was contamination. Newer data suggests that the prevalence of cross-sensitivity between PCN and cephalosporins is actually estimated to be closer to 1% (Hermanides et al., 2018).   Patients labeled as PCN allergic, whether they are truly allergic or not, have been found to have inferior outcomes in terms of rates of serious infection, and these patients tend to have longer hospital stays ([place], 2019).  Treatment related secondary infections, such as Clostridioides difficile, have been linked to the improper use of broad spectrum antibiotics in patients improperly labeled as PCN allergic (Kleris & Lugar, 2019).  Anaphylaxis from cephalosporins is rare and the evidence suggests that there is no increased risk of an anaphylactic type reaction when cephalosporins are used in a PCN allergic patient (Pichichero, 2006).  Citations: Hermanides J, Lemkes BA, Prins Pearla Dubonnet MW, Terreehorst I. Presumed ?-Lactam Allergy and Cross-reactivity in the Operating Theater: A Practical Approach. Anesthesiology. 2018 Aug;129(2):335-342. doi: 10.1097/ALN.0000000000002252. PMID: 21194174.  Kleris, [place]., & Lugar, P. L. (2019). Things We Do For No Reason: Failing to Question a Penicillin Allergy History. Journal of hospital medicine, 14(10), [phone]. Advance online publication. https://www.wallace-middleton.info/  Pichichero, M. E. (2006). Cephalosporins can be prescribed safely for penicillin-allergic patients. Journal of family medicine, 55(2),  106-112. Accessed: https://cdn.mdedge.com/files/s75fs-public/Document/September-2017/5502JFP_AppliedEvidence1.pdf   Honor Loh, MSN,  APRN, FNP-C, [facility]  Peri-operative Services Nurse Practitioner FAX: [phone] 01/06/21 8:38 AM

## 2021-01-07 ENCOUNTER — Other Ambulatory Visit: Payer: BLUE CROSS/BLUE SHIELD

## 2021-01-09 ENCOUNTER — Ambulatory Visit: Payer: BLUE CROSS/BLUE SHIELD

## 2021-01-09 ENCOUNTER — Encounter
Admission: RE | Disposition: A | Discharge status: Home / Self Care | Payer: Self-pay | Source: Home / Self Care | Attending: Podiatry

## 2021-01-09 ENCOUNTER — Ambulatory Visit
Admission: RE | Admit: 2021-01-09 | Discharge: 2021-01-09 | Disposition: A | Discharge status: Home / Self Care | Payer: BLUE CROSS/BLUE SHIELD | Attending: Podiatry | Admitting: Podiatry

## 2021-01-09 ENCOUNTER — Encounter: Payer: Self-pay | Admitting: Podiatry

## 2021-01-09 ENCOUNTER — Ambulatory Visit: Payer: BLUE CROSS/BLUE SHIELD | Admitting: Urgent Care

## 2021-01-09 ENCOUNTER — Other Ambulatory Visit: Payer: Self-pay

## 2021-01-09 DIAGNOSIS — T8484XA Pain due to internal orthopedic prosthetic devices, implants and grafts, initial encounter: Secondary | ICD-10-CM | POA: Diagnosis not present

## 2021-01-09 DIAGNOSIS — K219 Gastro-esophageal reflux disease without esophagitis: Secondary | ICD-10-CM | POA: Insufficient documentation

## 2021-01-09 DIAGNOSIS — Z6826 Body mass index (BMI) 26.0-26.9, adult: Secondary | ICD-10-CM | POA: Insufficient documentation

## 2021-01-09 DIAGNOSIS — E7800 Pure hypercholesterolemia, unspecified: Secondary | ICD-10-CM | POA: Insufficient documentation

## 2021-01-09 DIAGNOSIS — X58XXXA Exposure to other specified factors, initial encounter: Secondary | ICD-10-CM | POA: Insufficient documentation

## 2021-01-09 DIAGNOSIS — G4730 Sleep apnea, unspecified: Secondary | ICD-10-CM | POA: Insufficient documentation

## 2021-01-09 DIAGNOSIS — E669 Obesity, unspecified: Secondary | ICD-10-CM | POA: Insufficient documentation

## 2021-01-09 HISTORY — PX: HARDWARE REMOVAL: SHX979

## 2021-01-09 HISTORY — DX: Unspecified papilledema: H47.10

## 2021-01-09 HISTORY — DX: Other chronic pain: G89.29

## 2021-01-09 HISTORY — DX: Benign intracranial hypertension: G93.2

## 2021-01-09 HISTORY — DX: Unspecified dyspareunia: N94.10

## 2021-01-09 HISTORY — DX: Low back pain, unspecified: M54.50

## 2021-01-09 LAB — POCT PREGNANCY, URINE: Preg Test, Ur: NEGATIVE

## 2021-01-09 SURGERY — REMOVAL, HARDWARE
Anesthesia: General | Site: Toe | Laterality: Bilateral

## 2021-01-09 MED ORDER — FENTANYL CITRATE (PF) 100 MCG/2ML IJ SOLN
INTRAMUSCULAR | Status: AC
  Filled 2021-01-09: qty 2

## 2021-01-09 MED ORDER — ACETAMINOPHEN 10 MG/ML IV SOLN
INTRAVENOUS | Status: AC
  Filled 2021-01-09: qty 100

## 2021-01-09 MED ORDER — ACETAMINOPHEN 10 MG/ML IV SOLN: 1000.0000 mg | Freq: Once | INTRAVENOUS | Status: DC | PRN

## 2021-01-09 MED ORDER — LIDOCAINE-EPINEPHRINE 1 %-1:100000 IJ SOLN
INTRAMUSCULAR | Status: AC
  Filled 2021-01-09: qty 1

## 2021-01-09 MED ORDER — BUPIVACAINE LIPOSOME 1.3 % IJ SUSP
INTRAMUSCULAR | Status: DC | PRN
  Administered 2021-01-09: 20 mL via INTRAMUSCULAR

## 2021-01-09 MED ORDER — BUPIVACAINE LIPOSOME 1.3 % IJ SUSP
INTRAMUSCULAR | Status: DC | PRN
  Administered 2021-01-09: 10 mL

## 2021-01-09 MED ORDER — ONDANSETRON HCL 4 MG/2ML IJ SOLN
INTRAMUSCULAR | Status: AC
  Filled 2021-01-09: qty 2

## 2021-01-09 MED ORDER — LIDOCAINE HCL (CARDIAC) PF 100 MG/5ML IV SOSY
PREFILLED_SYRINGE | INTRAVENOUS | Status: DC | PRN
  Administered 2021-01-09: 100 mg via INTRAVENOUS

## 2021-01-09 MED ORDER — OXYCODONE HCL 5 MG PO TABS: 5.0000 mg | ORAL_TABLET | Freq: Once | ORAL | Status: DC | PRN

## 2021-01-09 MED ORDER — LACTATED RINGERS IV SOLN: INTRAVENOUS | Status: DC

## 2021-01-09 MED ORDER — FENTANYL CITRATE (PF) 100 MCG/2ML IJ SOLN
INTRAMUSCULAR | Status: DC | PRN
  Administered 2021-01-09 (×2): 12.5 ug via INTRAVENOUS
  Administered 2021-01-09: 25 ug via INTRAVENOUS
  Administered 2021-01-09 (×2): 12.5 ug via INTRAVENOUS

## 2021-01-09 MED ORDER — CHLORHEXIDINE GLUCONATE 0.12 % MT SOLN: 15.0000 mL | Freq: Once | OROMUCOSAL | Status: AC

## 2021-01-09 MED ORDER — ONDANSETRON HCL 4 MG/2ML IJ SOLN
INTRAMUSCULAR | Status: DC | PRN
  Administered 2021-01-09: 4 mg via INTRAVENOUS

## 2021-01-09 MED ORDER — BUPIVACAINE LIPOSOME 1.3 % IJ SUSP
INTRAMUSCULAR | Status: AC
  Filled 2021-01-09: qty 20

## 2021-01-09 MED ORDER — LIDOCAINE HCL (PF) 1 % IJ SOLN
INTRAMUSCULAR | Status: AC
  Filled 2021-01-09: qty 30

## 2021-01-09 MED ORDER — POVIDONE-IODINE 7.5 % EX SOLN
Freq: Once | CUTANEOUS | Status: DC
  Filled 2021-01-09: qty 118

## 2021-01-09 MED ORDER — BUPIVACAINE HCL (PF) 0.5 % IJ SOLN
INTRAMUSCULAR | Status: AC
  Filled 2021-01-09: qty 30

## 2021-01-09 MED ORDER — KETOROLAC TROMETHAMINE 30 MG/ML IJ SOLN
INTRAMUSCULAR | Status: DC | PRN
Start: 2021-01-09 — End: 2021-01-09
  Administered 2021-01-09: 30 mg via INTRAVENOUS

## 2021-01-09 MED ORDER — CHLORHEXIDINE GLUCONATE 0.12 % MT SOLN
OROMUCOSAL | Status: AC
  Administered 2021-01-09: 15 mL via OROMUCOSAL
  Filled 2021-01-09: qty 15

## 2021-01-09 MED ORDER — MIDAZOLAM HCL 2 MG/2ML IJ SOLN
INTRAMUSCULAR | Status: AC
  Filled 2021-01-09: qty 2

## 2021-01-09 MED ORDER — DEXAMETHASONE SODIUM PHOSPHATE 10 MG/ML IJ SOLN
INTRAMUSCULAR | Status: DC | PRN
  Administered 2021-01-09: 4 mg via INTRAVENOUS

## 2021-01-09 MED ORDER — PROPOFOL 10 MG/ML IV BOLUS
INTRAVENOUS | Status: DC | PRN
  Administered 2021-01-09: 200 mg via INTRAVENOUS

## 2021-01-09 MED ORDER — FAMOTIDINE 20 MG PO TABS
ORAL_TABLET | ORAL | Status: AC
  Administered 2021-01-09: 20 mg via ORAL
  Filled 2021-01-09: qty 1

## 2021-01-09 MED ORDER — ACETAMINOPHEN 10 MG/ML IV SOLN
INTRAVENOUS | Status: DC | PRN
  Administered 2021-01-09: 1000 mg via INTRAVENOUS

## 2021-01-09 MED ORDER — OXYCODONE HCL 5 MG/5ML PO SOLN: 5.0000 mg | Freq: Once | ORAL | Status: DC | PRN

## 2021-01-09 MED ORDER — DEXAMETHASONE SODIUM PHOSPHATE 10 MG/ML IJ SOLN
INTRAMUSCULAR | Status: AC
  Filled 2021-01-09: qty 1

## 2021-01-09 MED ORDER — PHENYLEPHRINE HCL-NACL 20-0.9 MG/250ML-% IV SOLN
INTRAVENOUS | Status: AC
  Filled 2021-01-09: qty 250

## 2021-01-09 MED ORDER — MIDAZOLAM HCL 2 MG/2ML IJ SOLN
INTRAMUSCULAR | Status: DC | PRN
Start: 2021-01-09 — End: 2021-01-09
  Administered 2021-01-09: 2 mg via INTRAVENOUS

## 2021-01-09 MED ORDER — FENTANYL CITRATE (PF) 100 MCG/2ML IJ SOLN: 25.0000 ug | INTRAMUSCULAR | Status: DC | PRN

## 2021-01-09 MED ORDER — OXYCODONE-ACETAMINOPHEN 5-325 MG PO TABS: 1.0000 | ORAL_TABLET | Freq: Four times a day (QID) | ORAL | 0 refills | Status: AC | PRN

## 2021-01-09 MED ORDER — FAMOTIDINE 20 MG PO TABS: 20.0000 mg | ORAL_TABLET | Freq: Once | ORAL | Status: AC

## 2021-01-09 MED ORDER — ORAL CARE MOUTH RINSE: 15.0000 mL | Freq: Once | OROMUCOSAL | Status: AC

## 2021-01-09 MED ORDER — CLINDAMYCIN PHOSPHATE 900 MG/50ML IV SOLN
900.0000 mg | INTRAVENOUS | Status: AC
Start: 2021-01-09 — End: 2021-01-09
  Administered 2021-01-09: 900 mg via INTRAVENOUS

## 2021-01-09 MED ORDER — PROPOFOL 10 MG/ML IV BOLUS
INTRAVENOUS | Status: AC
  Filled 2021-01-09: qty 20

## 2021-01-09 MED ORDER — 0.9 % SODIUM CHLORIDE (POUR BTL) OPTIME
TOPICAL | Status: DC | PRN
  Administered 2021-01-09: 500 mL

## 2021-01-09 MED ORDER — LIDOCAINE HCL (PF) 2 % IJ SOLN
INTRAMUSCULAR | Status: AC
  Filled 2021-01-09: qty 5

## 2021-01-09 MED ORDER — CLINDAMYCIN PHOSPHATE 900 MG/50ML IV SOLN
INTRAVENOUS | Status: AC
  Filled 2021-01-09: qty 50

## 2021-01-09 MED ORDER — ONDANSETRON HCL 4 MG/2ML IJ SOLN: 4.0000 mg | Freq: Once | INTRAMUSCULAR | Status: DC | PRN

## 2021-01-09 SURGICAL SUPPLY — 48 items
BLADE SURG 15 STRL LF DISP TIS (BLADE) ×2 IMPLANT
BLADE SURG 15 STRL SS (BLADE) ×4
BLADE SURG MINI STRL (BLADE) ×2 IMPLANT
BNDG CMPR STD VLCR NS LF 5.8X4 (GAUZE/BANDAGES/DRESSINGS) ×1
BNDG CONFORM 3 STRL LF (GAUZE/BANDAGES/DRESSINGS) ×3 IMPLANT
BNDG ELASTIC 4X5.8 VLCR NS LF (GAUZE/BANDAGES/DRESSINGS) ×2 IMPLANT
BNDG ESMARK 4X12 TAN STRL LF (GAUZE/BANDAGES/DRESSINGS) ×2 IMPLANT
BNDG GAUZE ELAST 4 BULKY (GAUZE/BANDAGES/DRESSINGS) ×2 IMPLANT
CAST PADDING 3X4FT ST 30246 (SOFTGOODS)
CUFF TOURN SGL QUICK 12 (TOURNIQUET CUFF) IMPLANT
CUFF TOURN SGL QUICK 18X4 (TOURNIQUET CUFF) IMPLANT
DRAPE BILAT LIMB 76X120 89291 (MISCELLANEOUS) ×1 IMPLANT
DRAPE FLUOR MINI C-ARM 54X84 (DRAPES) ×2 IMPLANT
DURAPREP 26ML APPLICATOR (WOUND CARE) ×3 IMPLANT
ELECT REM PT RETURN 9FT ADLT (ELECTROSURGICAL) ×2
ELECTRODE REM PT RTRN 9FT ADLT (ELECTROSURGICAL) ×1 IMPLANT
GAUZE 4X4 16PLY ~~LOC~~+RFID DBL (SPONGE) ×2 IMPLANT
GAUZE SPONGE 4X4 12PLY STRL (GAUZE/BANDAGES/DRESSINGS) ×2 IMPLANT
GAUZE XEROFORM 1X8 LF (GAUZE/BANDAGES/DRESSINGS) ×2 IMPLANT
GLOVE SURG ENC MOIS LTX SZ7.5 (GLOVE) ×2 IMPLANT
GLOVE SURG UNDER LTX SZ8 (GLOVE) ×2 IMPLANT
GOWN STRL REUS W/ TWL XL LVL3 (GOWN DISPOSABLE) ×2 IMPLANT
GOWN STRL REUS W/TWL XL LVL3 (GOWN DISPOSABLE) ×4
MANIFOLD NEPTUNE II (INSTRUMENTS) ×2 IMPLANT
NDL FILTER BLUNT 18X1 1/2 (NEEDLE) ×1 IMPLANT
NDL HYPO 25X1 1.5 SAFETY (NEEDLE) ×3 IMPLANT
NEEDLE FILTER BLUNT 18X 1/2SAF (NEEDLE) ×1
NEEDLE FILTER BLUNT 18X1 1/2 (NEEDLE) ×1 IMPLANT
NEEDLE HYPO 25X1 1.5 SAFETY (NEEDLE) ×6 IMPLANT
NS IRRIG 500ML POUR BTL (IV SOLUTION) ×2 IMPLANT
PACK EXTREMITY ARMC (MISCELLANEOUS) ×2 IMPLANT
PAD CAST CTTN 3X4 STRL (SOFTGOODS) ×1 IMPLANT
PADDING CAST COTTON 3X4 STRL (SOFTGOODS)
PUTTY DBX 1CC (Putty) ×2 IMPLANT
PUTTY DBX 1CC DEPUY (Putty) IMPLANT
STOCKINETTE M/LG 89821 (MISCELLANEOUS) ×2 IMPLANT
STOCKINETTE STRL 6IN 960660 (GAUZE/BANDAGES/DRESSINGS) ×2 IMPLANT
STRIP CLOSURE SKIN 1/4X4 (GAUZE/BANDAGES/DRESSINGS) ×2 IMPLANT
SUT MNCRL AB 4-0 PS2 18 (SUTURE) ×2 IMPLANT
SUT VIC AB 2-0 CT2 27 (SUTURE) ×1 IMPLANT
SUT VIC AB 3-0 SH 27 (SUTURE) ×4
SUT VIC AB 3-0 SH 27X BRD (SUTURE) IMPLANT
SUT VIC AB 4-0 FS2 27 (SUTURE) ×2 IMPLANT
SWABSTK COMLB BENZOIN TINCTURE (MISCELLANEOUS) ×3 IMPLANT
SYR 10ML LL (SYRINGE) ×2 IMPLANT
SYR 20ML LL LF (SYRINGE) ×2 IMPLANT
SYR 3ML LL SCALE MARK (SYRINGE) ×2 IMPLANT
WATER STERILE IRR 500ML POUR (IV SOLUTION) ×2 IMPLANT

## 2021-01-09 NOTE — OR Nursing (Signed)
Bilateral second toe silicon implants removed and discarded.

## 2021-01-09 NOTE — Anesthesia Postprocedure Evaluation (Signed)
Anesthesia Post Note  Patient: [name]  Procedure(s) Performed: SILICON IMPLANT REMOVAL (Bilateral: Toe)  Patient location during evaluation: PACU Anesthesia Type: General Level of consciousness: awake and alert, oriented and patient cooperative Pain management: pain level controlled Vital Signs Assessment: post-procedure vital signs reviewed and stable Respiratory status: spontaneous breathing, nonlabored ventilation and respiratory function stable Cardiovascular status: blood pressure returned to baseline and stable Postop Assessment: adequate PO intake Anesthetic complications: no   No notable events documented.   Last Vitals:  Vitals:   01/09/21 0954 01/09/21 1000  BP: (!) 113/57 120/75  Pulse: 87 86  Resp: 20 15  Temp: (!) 36.2 C   SpO2: 100% 100%    Last Pain:  Vitals:   01/09/21 0954  TempSrc:   PainSc: 0-No pain                 Darrin Nipper

## 2021-01-09 NOTE — H&P (Signed)
HISTORY AND PHYSICAL INTERVAL NOTE:  01/09/2021  7:25 AM  [name]  has presented today for surgery, with the diagnosis of T85.9XXD- Complication of impanted device.  The various methods of treatment have been discussed with the patient.  No guarantees were given.  After consideration of risks, benefits and other options for treatment, the patient has consented to surgery.  I have reviewed the patients' chart and labs.     A history and physical examination was performed in my office.  The patient was reexamined.  There have been no changes to this history and physical examination.  [name] A

## 2021-01-09 NOTE — Transfer of Care (Signed)
Immediate Anesthesia Transfer of Care Note  Patient: [name]  Procedure(s) Performed: SILICON IMPLANT REMOVAL (Bilateral: Toe)  Patient Location: PACU  Anesthesia Type:General  Level of Consciousness: drowsy and patient cooperative  Airway & Oxygen Therapy: Patient Spontanous Breathing  Post-op Assessment: Report given to RN and Post -op Vital signs reviewed and stable  Post vital signs: Reviewed and stable  Last Vitals:  Vitals Value Taken Time  BP 113/57 01/09/21 0951  Temp    Pulse 85 01/09/21 0955  Resp 16 01/09/21 0955  SpO2 100 % 01/09/21 0955  Vitals shown include unvalidated device data.  Last Pain:  Vitals:   01/09/21 0627  TempSrc: Tympanic  PainSc: 0-No pain      Patients Stated Pain Goal: 0 (37/16/96 7893)  Complications: No notable events documented.

## 2021-01-09 NOTE — Anesthesia Preprocedure Evaluation (Addendum)
Anesthesia Evaluation  Patient identified by MRN, date of birth, ID band Patient awake    Reviewed: Allergy & Precautions, NPO status , Patient's Chart, lab work & pertinent test results  History of Anesthesia Complications Negative for: history of anesthetic complications  Airway Mallampati: I   Neck ROM: Full    Dental no notable dental hx.    Pulmonary sleep apnea ,    Pulmonary exam normal breath sounds clear to auscultation       Cardiovascular Exercise Tolerance: Good + Valvular Problems/Murmurs (VSD)  Rhythm:Regular Rate:Normal + Systolic murmurs ECG 95/6/38: normal  Echo 11/30/19:  1. Normal left ventricular cavity size and systolic function.  2. Normal right ventricular cavity size and systolic function.  3. Tiny, hemodynamically non-significant perimembranous ventricular septal defect.     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  Cardiology note 11/30/19:  1. 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 She continues to deny any symptoms of significant shunting or PAH (shortnes of breath, measured hypoxia, increased edema). Echo completed today preliminarily reviewed by Dr. Rebekah Chesterfield ans showed a small VSD similar to the echo completed in 2016. No RV enlargement noted. She is planning on undergoing bariatric surgery in the near future. Due to her asymptomatic presentation, patient would be categorized as a low-risk candidate for such procedure.  Return in about 3 years (around 11/30/2022).    Reproductive/Obstetrics                           Anesthesia Physical Anesthesia Plan  ASA: 2  Anesthesia Plan: General   Post-op Pain Management:    Induction: Intravenous  PONV Risk Score and Plan: 2 and Ondansetron, Dexamethasone and Treatment may vary due to age or medical condition  Airway Management Planned: LMA  Additional Equipment:   Intra-op Plan:   Post-operative Plan: Extubation in OR  Informed Consent: I have reviewed the patients History and Physical, chart, labs and discussed the procedure including the risks, benefits and alternatives for the proposed anesthesia with the patient or authorized representative who has indicated his/her understanding and acceptance.     Dental Advisory Given  Plan Discussed with: CRNA  Anesthesia Plan Comments: (Patient consented for risks of anesthesia including but not limited to:  - adverse reactions to medications - damage to eyes, teeth, lips or other oral mucosa - nerve damage due to positioning  - sore throat or hoarseness - damage to heart, brain, nerves, lungs, other parts of body or loss of life  Informed patient about role of CRNA in peri- and intra-operative care.  Patient voiced understanding.)      Anesthesia Quick Evaluation

## 2021-01-09 NOTE — Anesthesia Procedure Notes (Signed)
Procedure Name: LMA Insertion Date/Time: 01/09/2021 7:35 AM Performed by: Lia Foyer, CRNA Pre-anesthesia Checklist: Patient identified, Emergency Drugs available, Suction available and Patient being monitored Patient Re-evaluated:Patient Re-evaluated prior to induction Oxygen Delivery Method: Circle system utilized Preoxygenation: Pre-oxygenation with 100% oxygen Induction Type: IV induction Ventilation: Mask ventilation without difficulty LMA: LMA inserted LMA Size: 4.0 Tube type: Oral Number of attempts: 1 Placement Confirmation: positive ETCO2 and breath sounds checked- equal and bilateral Tube secured with: Tape Dental Injury: Teeth and Oropharynx as per pre-operative assessment

## 2021-01-09 NOTE — Discharge Instructions (Addendum)
[facility]  POST OPERATIVE INSTRUCTIONS FOR DR. Vickki Muff AND DR. Lafe   Take your medication as prescribed.  Pain medication should be taken only as needed.  Keep the dressing clean, dry and intact.  Keep your foot elevated above the heart level for the first 48 hours.  Walking to the bathroom and brief periods of walking are acceptable, unless we have instructed you to be non-weight bearing.  Always wear your post-op shoe when walking.  Always use your crutches if you are to be non-weight bearing.  Do not take a shower. Baths are permissible as long as the foot is kept out of the water.   Every hour you are awake:  Bend your knee 15 times. Flex foot 15 times Massage calf 15 times  Call [facility] [phone]) if any of the following problems occur: You develop a temperature or fever. The bandage becomes saturated with blood. Medication does not stop your pain. Injury of the foot occurs. Any symptoms of infection including redness, odor, or red streaks running from wound.   AMBULATORY SURGERY  DISCHARGE INSTRUCTIONS   The drugs that you were given will stay in your system until tomorrow so for the next 24 hours you should not:  Drive an automobile Make any legal decisions Drink any alcoholic beverage   You may resume regular meals tomorrow.  Today it is better to start with liquids and gradually work up to solid foods.  You may eat anything you prefer, but it is better to start with liquids, then soup and crackers, and gradually work up to solid foods.   Please notify your doctor immediately if you have any unusual bleeding, trouble breathing, redness and pain at the surgery site, drainage, fever, or pain not relieved by medication.    Your post-operative visit with Dr.                                       is: Date:                        Time:    Please call to schedule your  post-operative visit.  Additional Instructions:

## 2021-01-09 NOTE — Op Note (Signed)
Operative note   Surgeon:Traver Meckes    Assistant:None    Preop diagnosis: 1.  Retained painful second MTPJ Silastic implant right foot 2.  Retained painful second MTPJ Silastic implant left foot    Postop diagnosis: Same    Procedure: 1.  Removal of painful second MTPJ Silastic implant with cheilectomy revision second MTPJ right foot 2.  Removal of painful second MTPJ Silastic implant with cheilectomy revision of second MTPJ left foot    EBL: Minimal    Anesthesia:local and general.  Local consisted of a one-to-one mixture of 0.5% bupivacaine plain and Exparel long-acting anesthetic.  Further supplementation with plain Exparel anesthetic was used at the end of the case.  A total of 10 cc of bupivacaine and 20 cc of Exparel was used    Hemostasis: Ankle tourniquet inflated to 200 mmHg on the left and right side for 53 minutes each    Specimen: Synovitis left second MTPJ    Complications: None    Operative indications:[name] is an 45 y.o. that presents today for surgical intervention.  The risks/benefits/alternatives/complications have been discussed and consent has been given.    Procedure:  Patient was brought into the OR and placed on the operating table in thesupine position. After anesthesia was obtained the bilateral lower extremity was prepped and draped in usual sterile fashion.  Attention was initially addressed to the left foot where after inflation of the tourniquet a curvilinear incision was made from the dorsal distal second metatarsal to the base of the proximal phalanx.  Sharp and blunt dissection carried down to the extensor tendon.  The extensor tendon was dissected away and retracted medially.  Next a flap of tissue off the base of the proximal phalanx was dissected away and brought back proximal exposing the MTPJ.  At this time the Silastic implant was noted within the joint space.  A large amount of periosteal bone reaction and exostosis was noted.  This was  removed and debrided away with a rongeur.  The Silastic implant was then removed in toto from the surgical field.  The distal metatarsal cavity and proximal phalanx cavity were then debrided with a curette and fibrotic tissue was removed.  Next DBX bone graft was then placed into the cavities of these region for stabilization.  After further debridement of bone and soft tissue the dorsal skin flap was placed into the joint region and sutured in place to the plantar tissue with a 3-0 Vicryl.  Good stability was noted at this time.  The wound was flushed with copious amounts of irrigation.  Closure was then performed with a 3-0 Vicryl for the deeper tissues and a 4-0 Monocryl for the skin.  This was then covered with a dressing and tourniquet released.  Attention was directed to the right foot where after inflation of the tourniquet a curvilinear incision was made over the second MTPJ from the distal metatarsal to the base of the proximal phalanx.  Sharp and blunt dissection was carried down to the extensor tendon.  The extensor tendon was dissected away and retracted medially.  A flap of thick soft tissue was created from the base of the proximal phalanx and dissected back to the dorsal metatarsal.  There was noted to be a large amount of periosteal bone formation and spurring around the MTPJ itself this was dissected away.  The Silastic implant was then noted and removed from the surgical field in toto.  The cavity created from the implant was debrided with  a curette of the distal metatarsal and base of the proximal phalanx.  Fibrotic tissue was noted in this area.  At this time the cavities were then filled with DBX bone graft.  Next the flap of soft tissue was placed into the joint cavity and sutured in place with a 3-0 Vicryl.  The wound was flushed with copious amounts of irrigation.  Closure was then performed with a 3-0 Vicryl and 4-0 Monocryl for the skin.  All areas were infiltrated with Exparel  long-acting anesthetic at the end of the case.  A bulky sterile dressing was then applied.    Patient tolerated the procedure and anesthesia well.  Was transported from the OR to the PACU with all vital signs stable and vascular status intact. To be discharged per routine protocol.  Will follow up in approximately 1 week in the outpatient clinic.  Prescription for Percocet was sent to the pharmacy.  Patient states she has tolerated this in the past.

## 2021-01-12 ENCOUNTER — Encounter: Payer: Self-pay | Admitting: Podiatry

## 2021-01-12 LAB — SURGICAL PATHOLOGY
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
## 2022-08-13 ENCOUNTER — Other Ambulatory Visit: Payer: Self-pay | Admitting: Family Medicine

## 2022-08-13 DIAGNOSIS — M5416 Radiculopathy, lumbar region: Secondary | ICD-10-CM

## 2022-08-26 ENCOUNTER — Ambulatory Visit
Admission: RE | Admit: 2022-08-26 | Discharge: 2022-08-26 | Disposition: A | Discharge status: Home / Self Care | Payer: BC Managed Care – PPO | Source: Ambulatory Visit | Attending: Family Medicine | Admitting: Family Medicine

## 2022-08-26 DIAGNOSIS — M5416 Radiculopathy, lumbar region: Secondary | ICD-10-CM

## 2022-09-03 ENCOUNTER — Other Ambulatory Visit: Payer: BLUE CROSS/BLUE SHIELD

## 2022-11-11 ENCOUNTER — Other Ambulatory Visit: Payer: Self-pay | Admitting: Obstetrics and Gynecology

## 2022-11-11 DIAGNOSIS — Z1231 Encounter for screening mammogram for malignant neoplasm of breast: Secondary | ICD-10-CM

## 2022-12-02 ENCOUNTER — Ambulatory Visit: Payer: BC Managed Care – PPO

## 2023-01-14 ENCOUNTER — Ambulatory Visit: Payer: BC Managed Care – PPO

## 2023-01-18 ENCOUNTER — Ambulatory Visit: Payer: BC Managed Care – PPO

## 2023-02-22 ENCOUNTER — Ambulatory Visit: Payer: BC Managed Care – PPO

## 2023-03-14 ENCOUNTER — Ambulatory Visit: Payer: BC Managed Care – PPO

## 2023-03-15 ENCOUNTER — Ambulatory Visit: Payer: BC Managed Care – PPO

## 2023-03-30 ENCOUNTER — Other Ambulatory Visit: Payer: Self-pay | Admitting: Family Medicine

## 2023-03-30 ENCOUNTER — Encounter: Payer: Self-pay | Admitting: Family Medicine

## 2023-03-30 DIAGNOSIS — M48062 Spinal stenosis, lumbar region with neurogenic claudication: Secondary | ICD-10-CM

## 2023-03-30 DIAGNOSIS — M5416 Radiculopathy, lumbar region: Secondary | ICD-10-CM

## 2023-03-31 ENCOUNTER — Encounter: Payer: Self-pay | Admitting: Family Medicine

## 2023-04-05 ENCOUNTER — Ambulatory Visit
Admission: RE | Admit: 2023-04-05 | Discharge: 2023-04-05 | Disposition: A | Discharge status: Home / Self Care | Payer: 59 | Source: Ambulatory Visit | Attending: Obstetrics and Gynecology | Admitting: Obstetrics and Gynecology

## 2023-04-05 ENCOUNTER — Ambulatory Visit
Admission: RE | Admit: 2023-04-05 | Discharge: 2023-04-05 | Disposition: A | Discharge status: Home / Self Care | Payer: Self-pay | Source: Ambulatory Visit | Attending: Family Medicine | Admitting: Family Medicine

## 2023-04-05 DIAGNOSIS — Z1231 Encounter for screening mammogram for malignant neoplasm of breast: Secondary | ICD-10-CM

## 2023-04-05 DIAGNOSIS — M48062 Spinal stenosis, lumbar region with neurogenic claudication: Secondary | ICD-10-CM

## 2023-04-05 DIAGNOSIS — M5416 Radiculopathy, lumbar region: Secondary | ICD-10-CM

## 2023-05-12 ENCOUNTER — Other Ambulatory Visit: Payer: Self-pay | Admitting: Student in an Organized Health Care Education/Training Program

## 2023-05-12 ENCOUNTER — Ambulatory Visit
Admission: RE | Admit: 2023-05-12 | Discharge: 2023-05-12 | Disposition: A | Discharge status: Home / Self Care | Source: Ambulatory Visit | Attending: Student in an Organized Health Care Education/Training Program | Admitting: Student in an Organized Health Care Education/Training Program

## 2023-05-12 ENCOUNTER — Encounter: Payer: Self-pay | Admitting: Student in an Organized Health Care Education/Training Program

## 2023-05-12 ENCOUNTER — Ambulatory Visit: Payer: 59 | Admitting: Student in an Organized Health Care Education/Training Program

## 2023-05-12 ENCOUNTER — Ambulatory Visit
Admission: RE | Admit: 2023-05-12 | Discharge: 2023-05-12 | Disposition: A | Discharge status: Home / Self Care | Payer: Self-pay | Source: Ambulatory Visit | Attending: Student in an Organized Health Care Education/Training Program | Admitting: Student in an Organized Health Care Education/Training Program

## 2023-05-12 VITALS — BP 139/82 | HR 85 | Temp 98.4°F | Resp 16 | Ht 68.0 in | Wt 156.0 lb

## 2023-05-12 DIAGNOSIS — R52 Pain, unspecified: Secondary | ICD-10-CM | POA: Diagnosis present

## 2023-05-12 DIAGNOSIS — G894 Chronic pain syndrome: Secondary | ICD-10-CM | POA: Diagnosis present

## 2023-05-12 DIAGNOSIS — M961 Postlaminectomy syndrome, not elsewhere classified: Secondary | ICD-10-CM | POA: Insufficient documentation

## 2023-05-12 DIAGNOSIS — G8929 Other chronic pain: Secondary | ICD-10-CM | POA: Diagnosis present

## 2023-05-12 DIAGNOSIS — M5416 Radiculopathy, lumbar region: Secondary | ICD-10-CM | POA: Diagnosis present

## 2023-05-12 DIAGNOSIS — Z981 Arthrodesis status: Secondary | ICD-10-CM | POA: Insufficient documentation

## 2023-05-12 NOTE — Patient Instructions (Addendum)
 Soap solution to cleanse back Please fax [place] [facility] eval that you have completed   ______________________________________________________________________    Procedure instructions  Stop blood-thinners  Do not eat or drink fluids (other than water) for 6 hours before your procedure  No water for 2 hours before your procedure  Take your blood pressure medicine with a sip of water  Arrive 30 minutes before your appointment  If sedation is planned, bring suitable driver. Pennie Banter, Benedetto Goad, & public transportation are NOT APPROVED)  Carefully read the "Preparing for your procedure" detailed instructions  If you have questions call us at [phone]  Procedure appointments are for procedures only. NO medication refills or new problem evaluations.   ______________________________________________________________________

## 2023-05-12 NOTE — Progress Notes (Signed)
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 following activities when performed: Preparing to see the patient (e.g., pre-charting review of records, searching for previously ordered imaging, lab work, and nerve conduction tests) Review of prior analgesic pharmacotherapies. Reviewing PMP Interpreting ordered tests (e.g., lab work, imaging, nerve conduction tests) Performing post-procedure evaluations, including interpretation of diagnostic procedures Obtaining and/or reviewing separately obtained history Performing a medically appropriate examination and/or evaluation Counseling and educating the patient/family/caregiver Ordering medications, tests, or procedures Referring and communicating with other health care professionals (when not separately reported) Documenting clinical information in the electronic or other health record Independently interpreting results (not  separately reported) and communicating results to the patient/ family/caregiver Care coordination (not separately reported)  Note by: Edward Jolly, MD (AI and TTS technology used. I apologize for any typographical errors that were not detected and corrected.) Date: 05/12/2023; Time: 12:20 PM

## 2023-05-12 NOTE — Progress Notes (Signed)
 Safety precautions to be maintained throughout the outpatient stay will include: orient to surroundings, keep bed in low position, maintain call bell within reach at all times, provide assistance with transfer out of bed and ambulation.

## 2023-05-17 ENCOUNTER — Inpatient Hospital Stay: Attending: Oncology | Admitting: Oncology

## 2023-05-17 ENCOUNTER — Encounter: Payer: Self-pay | Admitting: Oncology

## 2023-05-17 ENCOUNTER — Inpatient Hospital Stay

## 2023-05-17 VITALS — BP 153/84 | HR 103 | Temp 96.2°F | Resp 18 | Wt 165.8 lb

## 2023-05-17 DIAGNOSIS — Z9884 Bariatric surgery status: Secondary | ICD-10-CM | POA: Insufficient documentation

## 2023-05-17 DIAGNOSIS — D509 Iron deficiency anemia, unspecified: Secondary | ICD-10-CM | POA: Insufficient documentation

## 2023-05-17 DIAGNOSIS — Z1379 Encounter for other screening for genetic and chromosomal anomalies: Secondary | ICD-10-CM

## 2023-05-17 DIAGNOSIS — Z8543 Personal history of malignant neoplasm of ovary: Secondary | ICD-10-CM | POA: Insufficient documentation

## 2023-05-17 NOTE — Progress Notes (Signed)
 Hematology/Oncology Consult note [phone] [phone]        REFERRING PROVIDER: Kandyce Rud, MD   CHIEF COMPLAINTS/REASON FOR VISIT:  Evaluation of anemia   ASSESSMENT & PLAN:   Iron deficiency anemia Labs are reviewed and discussed with patient. Results are consistent with iron deficient anemia.  She has been on oral iron supplementation which is not effective. History of gastric bypass.  I discussed about option  IV Venofer treatments. I discussed about the potential risks including but not limited to allergic reactions/infusion reactions including anaphylactic reactions, diarrhea, phlebitis, high blood pressure, wheezing, SOB, skin rash, weight gain,dark urine, leg swelling, back pain, headache, nausea and fatigue, etc. Patient agrees with the plan.  Recommend IV venofer weekly x 4  Patient has history of infertility, and also uses Condom. She denies any chance of pregnancy and declines pregnancy testing prior to Venofer.    History of ovarian cancer History of right Primary mucinous adenocarcinoma of ovary diagnosed in 2001, s/p Right oophorectomy Refer to genetic counselor  History of gastric bypass Check B12, folate at next visit.    Orders Placed This Encounter  Procedures   CBC with Differential (Cancer Center Only)    Standing Status:   Future    Expected Date:   08/17/2023    Expiration Date:   05/16/2024   Iron and TIBC    Standing Status:   Future    Expected Date:   08/17/2023    Expiration Date:   05/16/2024   Ferritin    Standing Status:   Future    Expected Date:   08/17/2023    Expiration Date:   05/16/2024   Retic Panel    Standing Status:   Future    Expected Date:   08/17/2023    Expiration Date:   05/16/2024   Ambulatory referral to Genetics    Referral Priority:   Routine    Referral Type:   Consultation    Referral Reason:   Specialty Services Required    Number of Visits Requested:   1   Follow up 3 months All  questions were answered. The patient knows to call the clinic with any problems, questions or concerns.  Rickard Patience, MD, PhD [facility] Hematology Oncology 05/17/2023   HISTORY OF PRESENTING ILLNESS:   [name] is a  48 y.o.  female with PMH listed below was seen in consultation at the request of  Kandyce Rud, MD  for evaluation of microcytic anemia.   She was diagnosed with iron deficiency anemia In Dec 2024.  She has been on oral iron supplements since December, approximately two to three months ago, and is tolerating them well. Despite this, recent blood work on 05/10/2023 revealed Hb 9.4, and iron saturation 4, TIBC 513, She has a history of gastric bypass, which may contribute to malabsorption of iron. No heavy menstrual bleeding, stomach pain, blood in the stool, or dark stool. She feels "always tired"but has no palpitations, heart racing, shortness of breath, or lightheadedness.   Her past medical history includes mucinous adenocarcinoma of the right ovary, treated with right oophorectomy and a history of thyroid issues. No family history of ovarian or breast cancer, but her mother had thyroid cancer.  She is sexually active, denies any chance of pregnancy, and uses condoms as a contraceptive measure. She has a history of infertility and has four adopted children. She has a history of right infratentorial meningioma.   She inquired about Cowden syndrome due to  her history of brain tumor, ovarian cancer, and thyroid issues. She is interested in genetic testing for this condition.  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 Bx showed  follicular adenoma.   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   Pseudotumor cerebri syndrome    Sleep apnea    a.) mild; does NOT use nocturnal PAP therapy   VSD (ventricular septal defect)    a.) TTE 04/07/2012: EF 55-60%; small membranous VSD with L-R flow. b.) TTE 05/10/2014: very small membranou sVSD; normal RVSP. c.) TTE 11/30/2019: EF 59%; tiny hemodynamically non-significant perimembranous VSD.    SURGICAL HISTORY: Past Surgical History:  Procedure Laterality Date   ANGIOPLASTY     BACK SURGERY     x 2   BRAIN SURGERY  2016   2 stents in right transverse sinus/ tumor-partial removal of benign tumor   FOOT SURGERY Bilateral    HARDWARE REMOVAL Bilateral 01/09/2021   Procedure: SILICON IMPLANT REMOVAL;  Surgeon: Gwyneth Revels, DPM;  Location: [facility] ORS;  Service: Podiatry;  Laterality: Bilateral;  SECOND TOE   LAPAROSCOPIC GASTRIC BYPASS  05/2020   OOPHORECTOMY Right    cancer   SPINAL FUSION     spinal tap     THYROID SURGERY Right    hemithyroidectomy    SOCIAL HISTORY: Social History   Socioeconomic History   Marital status: Married    Spouse name: Not on file   Number of children: Not on file   Years of education: Not on file   Highest education level: Not on file  Occupational History   Not on file  Tobacco Use   Smoking status: Never   Smokeless tobacco: Never  Substance and Sexual Activity   Alcohol use: No   Drug use: No   Sexual activity: Yes  Other Topics Concern   Not on file  Social History Narrative   Not on file   Social Drivers of Health   Financial Resource Strain: Patient Declined (09/13/2022)   Received from [facility]   Overall Financial Resource Strain (CARDIA)    Difficulty of  Paying Living Expenses: Patient declined  Food Insecurity: Patient Declined (09/13/2022)   Received from [facility]   Hunger Vital Sign    Worried About Running Out of Food in the Last Year: Patient declined    Ran Out of Food in the Last Year: Patient declined  Transportation Needs: Patient Declined (09/13/2022)   Received from [facility] - Transportation    Lack of Transportation (Medical): Patient declined    Lack of Transportation (Non-Medical): Patient declined  Physical Activity: Patient Declined (09/13/2022)   Received from [facility]   Exercise Vital Sign    Days of Exercise per Week: Patient declined    Minutes of Exercise per Session: Patient declined  Stress: Patient Declined (09/13/2022)  Received from [facility] - Occupational Stress Questionnaire    Feeling of Stress : Patient declined  Social Connections: Unknown (07/29/2021)   Received from [facility]   Social Network    Social Network: Not on file  Intimate Partner Violence: Not At Risk (09/13/2022)   Received from [facility]   HITS    Over the last 12 months how often did your partner physically hurt you?: Never    Over the last 12 months how often did your partner insult you or talk down to you?: Never    Over the last 12 months how often did your partner threaten you with physical harm?: Never    Over the last 12 months how often did your partner scream or curse at you?: Never    FAMILY HISTORY: Family History  Problem Relation Age of Onset   Hypertension Father    Diabetes Father    Heart disease Maternal Grandmother     ALLERGIES:  is allergic to morphine and penicillins.  MEDICATIONS:  Current Outpatient Medications  Medication Sig Dispense Refill   aspirin 81 MG tablet Take 81 mg by mouth at bedtime.      DULoxetine (CYMBALTA) 60 MG capsule Take 60 mg by mouth at bedtime.     HYDROcodone-acetaminophen (NORCO) 10-325 MG  tablet Take 1 tablet by mouth every 6 (six) hours as needed. (Patient not taking: Reported on 12/23/2020) 20 tablet 0   nortriptyline (PAMELOR) 50 MG capsule Take 50 mg by mouth at bedtime.     oxyCODONE-acetaminophen (PERCOCET) 5-325 MG tablet Take 1-2 tablets by mouth every 6 (six) hours as needed for severe pain. Max 6 tabs per day (Patient not taking: Reported on 05/12/2023) 30 tablet 0   No current facility-administered medications for this visit.    Review of Systems  Constitutional:  Negative for appetite change, chills, fatigue and fever.  HENT:   Negative for hearing loss and voice change.   Eyes:  Negative for eye problems.  Respiratory:  Negative for chest tightness and cough.   Cardiovascular:  Negative for chest pain.  Gastrointestinal:  Negative for abdominal distention, abdominal pain and blood in stool.  Endocrine: Negative for hot flashes.  Genitourinary:  Negative for difficulty urinating and frequency.   Musculoskeletal:  Negative for arthralgias.  Skin:  Negative for itching and rash.  Neurological:  Negative for extremity weakness.  Hematological:  Negative for adenopathy.  Psychiatric/Behavioral:  Negative for confusion.    PHYSICAL EXAMINATION:  Vitals:   05/17/23 0942  BP: (!) 153/84  Pulse: (!) 103  Resp: 18  Temp: (!) 96.2 F (35.7 C)  SpO2: 100%   Filed Weights   05/17/23 0942  Weight: 165 lb 12.8 oz (75.2 kg)    Physical Exam Constitutional:      General: She is not in acute distress. HENT:     Head: Normocephalic and atraumatic.  Eyes:     General: No scleral icterus. Cardiovascular:     Rate and Rhythm: Normal rate and regular rhythm.     Heart sounds: Normal heart sounds.  Pulmonary:     Effort: Pulmonary effort is normal. No respiratory distress.     Breath sounds: No wheezing.  Abdominal:     General: Bowel sounds are normal. There is no distension.     Palpations: Abdomen is soft.  Musculoskeletal:        General: No deformity.  Normal range of motion.  Cervical back: Normal range of motion and neck supple.  Skin:    General: Skin is warm and dry.     Findings: No erythema or rash.  Neurological:     Mental Status: She is alert and oriented to person, place, and time. Mental status is at baseline.  Psychiatric:        Mood and Affect: Mood normal.     LABORATORY DATA:  I have reviewed the data as listed    Latest Ref Rng & Units 12/23/2010   10:53 AM 12/02/2009    3:02 PM  CBC  WBC 4.0 - 10.5 K/uL 6.1  7.3   Hemoglobin 12.0 - 15.0 g/dL 40.9  81.1   Hematocrit 36.0 - 46.0 % 39.9  41.7   Platelets 150 - 400 K/uL 232  223       Latest Ref Rng & Units 12/23/2010   10:53 AM 05/30/2009    8:48 AM  CMP  Glucose 70 - 99 mg/dL 89  98   BUN 6 - 23 mg/dL 11  10   Creatinine 9.14 - 1.10 mg/dL 7.82  0.7   Sodium 956 - 145 mEq/L 138  141   Potassium 3.5 - 5.1 mEq/L 4.0  3.9   Chloride 96 - 112 mEq/L 102  103   CO2 19 - 32 mEq/L 27  30   Calcium 8.4 - 10.5 mg/dL 9.7  9.5       RADIOGRAPHIC STUDIES: I have personally reviewed the radiological images as listed and agreed with the findings in the report. DG PAIN CLINIC C-ARM 1-60 MIN NO REPORT Result Date: 05/12/2023 Fluoro was used, but no Radiologist interpretation will be provided. Please refer to "NOTES" tab for provider progress note.  MM 3D SCREENING MAMMOGRAM BILATERAL BREAST Result Date: 04/07/2023 CLINICAL DATA:  Screening. EXAM: DIGITAL SCREENING BILATERAL MAMMOGRAM WITH TOMOSYNTHESIS AND CAD TECHNIQUE: Bilateral screening digital craniocaudal and mediolateral oblique mammograms were obtained. Bilateral screening digital breast tomosynthesis was performed. The images were evaluated with computer-aided detection. COMPARISON:  Previous exam(s). ACR Breast Density Category b: There are scattered areas of fibroglandular density. FINDINGS: There are no findings suspicious for malignancy. IMPRESSION: No mammographic evidence of malignancy. A result letter of  this screening mammogram will be mailed directly to the patient. RECOMMENDATION: Screening mammogram in one year. (Code:SM-B-01Y) BI-RADS CATEGORY  1: Negative. Electronically Signed   By: Amie Portland M.D.   On: 04/07/2023 07:41   MR LUMBAR SPINE WO CONTRAST Result Date: 04/05/2023 CLINICAL DATA:  Chronic low back pain. History prior lumbar surgery. EXAM: MRI LUMBAR SPINE WITHOUT CONTRAST TECHNIQUE: Multiplanar, multisequence MR imaging of the lumbar spine was performed. No intravenous contrast was administered. COMPARISON:  MRI lumbar spine 08/26/2022. Plain films lumbar spine 05/29/2019. FINDINGS: Segmentation:  Standard. Alignment:  Normal. Vertebrae: No fracture, evidence of discitis, or bone lesion. The patient is status post solid L4-5 fusion with unilateral pedicle screws and stabilization bars on the left. Conus medullaris and cauda equina: Conus extends to the L1 level. Conus and cauda equina appear normal. Paraspinal and other soft tissues: Negative. Disc levels: T11-12 is imaged in the sagittal plane only. There is a shallow left paracentral protrusion but the central canal and foramina appear open. No change. T12-L1: Negative. L1-2: Negative. L2-3: Mild facet arthropathy and a shallow disc bulge.  No stenosis. L3-4: Status post fusion.  No stenosis or complicating feature. L4-5: Minimal disc bulge and mild facet arthropathy. The central canal is open. Mild bilateral foraminal narrowing is unchanged.  L5-S1: Mild facet degenerative change.  Otherwise negative. IMPRESSION: 1. No change in the appearance of the lumbar spine since the prior MRI. 2. Status post solid L3-4 fusion. No stenosis or complicating feature at L3-4. 3. Mild bilateral foraminal narrowing at L4-5. The central canal is open at all levels. Electronically Signed   By: Drusilla Kanner M.D.   On: 04/05/2023 09:02

## 2023-05-17 NOTE — Assessment & Plan Note (Signed)
 Check B12, folate at next visit

## 2023-05-17 NOTE — Assessment & Plan Note (Addendum)
 Labs are reviewed and discussed with patient. Results are consistent with iron deficient anemia.  She has been on oral iron supplementation which is not effective. History of gastric bypass.  I discussed about option  IV Venofer treatments. I discussed about the potential risks including but not limited to allergic reactions/infusion reactions including anaphylactic reactions, diarrhea, phlebitis, high blood pressure, wheezing, SOB, skin rash, weight gain,dark urine, leg swelling, back pain, headache, nausea and fatigue, etc. Patient agrees with the plan.  Recommend IV venofer weekly x 4  Patient has history of infertility, and also uses Condom. She denies any chance of pregnancy and declines pregnancy testing prior to Venofer.

## 2023-05-17 NOTE — Assessment & Plan Note (Addendum)
 History of right Primary mucinous adenocarcinoma of ovary diagnosed in 2001, s/p Right oophorectomy Refer to genetic counselor

## 2023-05-19 ENCOUNTER — Inpatient Hospital Stay

## 2023-05-19 VITALS — BP 116/76 | HR 88 | Temp 96.5°F | Resp 18

## 2023-05-19 DIAGNOSIS — D508 Other iron deficiency anemias: Secondary | ICD-10-CM

## 2023-05-19 DIAGNOSIS — D509 Iron deficiency anemia, unspecified: Secondary | ICD-10-CM | POA: Diagnosis not present

## 2023-05-19 MED ORDER — IRON SUCROSE 20 MG/ML IV SOLN
200.0000 mg | Freq: Once | INTRAVENOUS | Status: AC
  Administered 2023-05-19: 200 mg via INTRAVENOUS
  Filled 2023-05-19: qty 10

## 2023-05-19 NOTE — Patient Instructions (Signed)

## 2023-05-26 ENCOUNTER — Inpatient Hospital Stay

## 2023-05-26 VITALS — BP 133/92 | HR 74 | Temp 97.4°F | Resp 18

## 2023-05-26 DIAGNOSIS — D508 Other iron deficiency anemias: Secondary | ICD-10-CM

## 2023-05-26 DIAGNOSIS — D509 Iron deficiency anemia, unspecified: Secondary | ICD-10-CM | POA: Diagnosis not present

## 2023-05-26 MED ORDER — IRON SUCROSE 20 MG/ML IV SOLN
200.0000 mg | Freq: Once | INTRAVENOUS | Status: AC
Start: 2023-05-26 — End: 2023-05-26
  Administered 2023-05-26: 200 mg via INTRAVENOUS
  Filled 2023-05-26: qty 10

## 2023-05-26 MED ORDER — SODIUM CHLORIDE 0.9% FLUSH
10.0000 mL | Freq: Once | INTRAVENOUS | Status: AC | PRN
  Administered 2023-05-26: 10 mL
  Filled 2023-05-26: qty 10

## 2023-06-02 ENCOUNTER — Inpatient Hospital Stay

## 2023-06-02 VITALS — BP 121/84 | HR 73 | Temp 97.1°F | Resp 18

## 2023-06-02 DIAGNOSIS — D509 Iron deficiency anemia, unspecified: Secondary | ICD-10-CM | POA: Diagnosis not present

## 2023-06-02 DIAGNOSIS — D508 Other iron deficiency anemias: Secondary | ICD-10-CM

## 2023-06-02 MED ORDER — IRON SUCROSE 20 MG/ML IV SOLN
200.0000 mg | Freq: Once | INTRAVENOUS | Status: AC
  Administered 2023-06-02: 200 mg via INTRAVENOUS
  Filled 2023-06-02: qty 10

## 2023-06-06 ENCOUNTER — Encounter: Payer: Self-pay | Admitting: Oncology

## 2023-06-07 ENCOUNTER — Other Ambulatory Visit: Payer: Self-pay

## 2023-06-07 DIAGNOSIS — D508 Other iron deficiency anemias: Secondary | ICD-10-CM

## 2023-06-07 NOTE — Telephone Encounter (Signed)
 Please cancel venofer on 4/3 and schedule a lab instead (4/3 @ 8:30)

## 2023-06-09 ENCOUNTER — Inpatient Hospital Stay: Attending: Oncology

## 2023-06-09 ENCOUNTER — Inpatient Hospital Stay

## 2023-06-09 DIAGNOSIS — Z9884 Bariatric surgery status: Secondary | ICD-10-CM | POA: Insufficient documentation

## 2023-06-09 DIAGNOSIS — D509 Iron deficiency anemia, unspecified: Secondary | ICD-10-CM | POA: Insufficient documentation

## 2023-06-09 DIAGNOSIS — D508 Other iron deficiency anemias: Secondary | ICD-10-CM

## 2023-06-09 LAB — CBC WITH DIFFERENTIAL (CANCER CENTER ONLY)
Abs Immature Granulocytes: 0.01 10*3/uL (ref 0.00–0.07)
Basophils Absolute: 0 10*3/uL (ref 0.0–0.1)
Basophils Relative: 1 %
Eosinophils Absolute: 0.1 10*3/uL (ref 0.0–0.5)
Eosinophils Relative: 1 %
HCT: 34 % — ABNORMAL LOW (ref 36.0–46.0)
Hemoglobin: 10.8 g/dL — ABNORMAL LOW (ref 12.0–15.0)
Immature Granulocytes: 0 %
Lymphocytes Relative: 37 %
Lymphs Abs: 1.6 10*3/uL (ref 0.7–4.0)
MCH: 25.8 pg — ABNORMAL LOW (ref 26.0–34.0)
MCHC: 31.8 g/dL (ref 30.0–36.0)
MCV: 81.1 fL (ref 80.0–100.0)
Monocytes Absolute: 0.4 10*3/uL (ref 0.1–1.0)
Monocytes Relative: 8 %
Neutro Abs: 2.3 10*3/uL (ref 1.7–7.7)
Neutrophils Relative %: 53 %
Platelet Count: 244 10*3/uL (ref 150–400)
RBC: 4.19 MIL/uL (ref 3.87–5.11)
RDW: 21.4 % — ABNORMAL HIGH (ref 11.5–15.5)
WBC Count: 4.3 10*3/uL (ref 4.0–10.5)
nRBC: 0 % (ref 0.0–0.2)

## 2023-06-09 LAB — RETIC PANEL
Immature Retic Fract: 11.1 % (ref 2.3–15.9)
RBC.: 4.19 MIL/uL (ref 3.87–5.11)
Retic Count, Absolute: 64.5 10*3/uL (ref 19.0–186.0)
Retic Ct Pct: 1.5 % (ref 0.4–3.1)
Reticulocyte Hemoglobin: 33.9 pg (ref >27.9)

## 2023-06-10 ENCOUNTER — Telehealth: Payer: Self-pay

## 2023-06-10 NOTE — Telephone Encounter (Signed)
-----   Message from Rickard Patience sent at 06/10/2023 10:57 AM EDT ----- Hemoglobin has improved from 9.4 to 10.8.  We can hold off additional Venofer given her experiencing side effects.  I recommend her to keep her next appt.  Thanks.

## 2023-06-15 ENCOUNTER — Encounter: Payer: Self-pay | Admitting: Oncology

## 2023-06-15 NOTE — Telephone Encounter (Signed)
 Spoke to pt and informed her MD recommendation. Pt verbalized understanding

## 2023-07-07 ENCOUNTER — Ambulatory Visit: Admitting: Urology

## 2023-07-07 DIAGNOSIS — R3914 Feeling of incomplete bladder emptying: Secondary | ICD-10-CM

## 2023-07-19 ENCOUNTER — Other Ambulatory Visit: Payer: Self-pay | Admitting: Licensed Clinical Social Worker

## 2023-07-19 ENCOUNTER — Encounter: Payer: Self-pay | Admitting: Licensed Clinical Social Worker

## 2023-07-19 ENCOUNTER — Inpatient Hospital Stay

## 2023-07-19 ENCOUNTER — Inpatient Hospital Stay: Attending: Oncology | Admitting: Licensed Clinical Social Worker

## 2023-07-19 DIAGNOSIS — Z8543 Personal history of malignant neoplasm of ovary: Secondary | ICD-10-CM

## 2023-07-19 LAB — GENETIC SCREENING ORDER

## 2023-07-19 NOTE — Progress Notes (Signed)
 REFERRING PROVIDER: Timmy Forbes, MD [address] [place],  [place] [address]  PRIMARY PROVIDER:  Nestor Banter, MD  PRIMARY REASON FOR VISIT:  1. History of ovarian cancer      HISTORY OF PRESENT ILLNESS:   [name], a 48 y.o. female, was seen for a [facility] cancer genetics consultation at the request of Dr. Wilhelmenia Browning due to a personal history of mucinous ovarian cancer.  [name] presents to clinic today to discuss the possibility of a hereditary predisposition to cancer, genetic testing, and to further clarify her future cancer risks, as well as potential cancer risks for family members.   CANCER HISTORY:  At the age of 75, [name] was diagnosed with mucinous ovarian cancer. The treatment plan included right oophorectomy. She also has history of meningioma and part of thyroid  removed that showed follicular adenoma.   RISK FACTORS:  Ovaries intact: has left ovary.  Hysterectomy: no.  Menopausal status: premenopausal.  HRT use: 0 years. Colonoscopy: yes; normal. Mammogram within the last year: yes.  Past Medical History:  Diagnosis Date   ALLERGIC RHINITIS    Avascular necrosis of bone (HCC)    Benign intracranial hypertension    Benign neoplasm of cerebral meninges (HCC)    Chronic low back pain    DDD (degenerative disc disease), lumbar    DEPRESSION    Dyspareunia in female    Follicular adenoma of thyroid  gland 05/04/2001   a.) s/p hemithroidectomy. Bx showed follicular adenoma.   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   Pseudotumor cerebri syndrome    Sleep apnea    a.) mild; does NOT use nocturnal PAP therapy   VSD (ventricular septal defect)    a.) TTE 04/07/2012: EF 55-60%; small membranous VSD with  L-R flow. b.) TTE 05/10/2014: very small membranou sVSD; normal RVSP. c.) TTE 11/30/2019: EF 59%; tiny hemodynamically non-significant perimembranous VSD.    Past Surgical History:  Procedure Laterality Date   ANGIOPLASTY     BACK SURGERY     x 2   BRAIN SURGERY  2016   2 stents in right transverse sinus/ tumor-partial removal of benign tumor   FOOT SURGERY Bilateral    HARDWARE REMOVAL Bilateral 01/09/2021   Procedure: SILICON IMPLANT REMOVAL;  Surgeon: [name], DPM;  Location: [facility] ORS;  Service: Podiatry;  Laterality: Bilateral;  SECOND TOE   LAPAROSCOPIC GASTRIC BYPASS  05/2020   OOPHORECTOMY Right    cancer   SPINAL FUSION     spinal tap     THYROID  SURGERY Right    hemithyroidectomy    FAMILY HISTORY:  We obtained a detailed, 4-generation family history.  Significant diagnoses are listed below: Family History  Problem Relation Age of Onset   Thyroid  cancer Mother    Hypertension Father    Diabetes Father    Thyroid  cancer Paternal Aunt    Heart disease Maternal Grandmother    Cancer Paternal Grandmother        uterine or ovarian    [name] has adopted children. She has 1 full sister who passed at 105. She has 1 paternal half sister, 1 paternal half brother, no cancers.  [name] mother had thyroid  cancer around age 9 and is  living at 70. No other known cancers on this side of the family.  [name] father is living at 33, no history of cancer. A paternal aunt had thyroid  cancer at 4. Paternal grandmother had uterine or ovarian cancer and passed over 34.   [name] is unaware of previous family history of genetic testing for hereditary cancer risks. There is no reported Ashkenazi Jewish ancestry. There is no known consanguinity.    GENETIC COUNSELING ASSESSMENT: [name] is a 47 y.o. female with a personal history of ovarian cancer which is somewhat suggestive of a hereditary cancer syndrome and predisposition to cancer. We, therefore,  discussed and recommended the following at today's visit.   DISCUSSION: We discussed that approximately 10% of cancer is hereditary. Mucinous ovarian cancer can be associated with Lynch syndrome genes, although there are other genes associated with hereditary cancer as well. Cancers and risks are gene specific. We discussed that testing is beneficial for several reasons including knowing about cancer risks, identifying potential screening and risk-reduction options that may be appropriate, and to understand if other family members could be at risk for cancer and allow them to undergo genetic testing.   We reviewed the characteristics, features and inheritance patterns of hereditary cancer syndromes. We also discussed genetic testing, including the appropriate family members to test, the process of testing, insurance coverage and turn-around-time for results. We discussed the implications of a negative, positive and/or variant of uncertain significant result. We recommended [name] pursue genetic testing for the [facility] CancerNext-Expanded+RNA gene panel.   The CancerNext-Expanded gene panel offered by [facility] and includes sequencing, rearrangement, and RNA analysis for the following 77 genes: AIP, ALK, APC, ATM, AXIN2, BAP1, BARD1, BMPR1A, BRCA1, BRCA2, BRIP1, CDC73, CDH1, CDK4, CDKN1B, CDKN2A, CEBPA, CHEK2, CTNNA1, DDX41, DICER1, ETV6, FH, FLCN, GATA2, LZTR1, MAX, MBD4, MEN1, MET, MLH1, MSH2, MSH3, MSH6, MUTYH, NF1, NF2, NTHL1, PALB2, PHOX2B, PMS2, POT1, PRKAR1A, PTCH1, PTEN, RAD51C, RAD51D, RB1, RET, RPS20, RUNX1, SDHA, SDHAF2, SDHB, SDHC, SDHD, SMAD4, SMARCA4, SMARCB1, SMARCE1, STK11, SUFU, TMEM127, TP53, TSC1, TSC2, VHL, and WT1 (sequencing and deletion/duplication); EGFR, HOXB13, KIT, MITF, PDGFRA, POLD1, and POLE (sequencing only); EPCAM and GREM1 (deletion/duplication only).   Based on [name]'s personal history of cancer, she meets medical criteria for genetic testing. Despite that she  meets criteria, she may still have an out of pocket cost.  PLAN: After considering the risks, benefits, and limitations, [name] provided informed consent to pursue genetic testing and the blood sample was sent to [facility] for analysis of the CancerNext+RNA panel. Results should be available within approximately 2-3 weeks' time, at which point they will be disclosed by telephone to [name], as will any additional recommendations warranted by these results. [name] will receive a summary of her genetic counseling visit and a copy of her results once available. This information will also be available in Epic.   [name] questions were answered to her satisfaction today. Our contact information was provided should additional questions or concerns arise. Thank you for the referral and allowing us  to share in the care of your patient.   Valri Gee, MS, [facility] Genetic Counselor [email or web address] Phone: [phone]  50 minutes were spent on the date of the encounter in service to the patient including preparation, face-to-face consultation, documentation and care coordination. Dr. Nelson Bandy was available for discussion regarding this case.   _______________________________________________________________________ For Office Staff:  Number of people involved in session: 1 Was an Intern/ student involved with case: no

## 2023-07-28 ENCOUNTER — Ambulatory Visit: Admitting: Urology

## 2023-08-02 ENCOUNTER — Ambulatory Visit: Payer: Self-pay | Admitting: Licensed Clinical Social Worker

## 2023-08-02 ENCOUNTER — Telehealth: Payer: Self-pay | Admitting: Licensed Clinical Social Worker

## 2023-08-02 DIAGNOSIS — Z1379 Encounter for other screening for genetic and chromosomal anomalies: Secondary | ICD-10-CM | POA: Insufficient documentation

## 2023-08-02 NOTE — Telephone Encounter (Signed)
 I contacted [name] to discuss her genetic testing results. No pathogenic variants were identified in the 77 genes analyzed. Detailed clinic note to follow.   The test report has been scanned into EPIC and is located under the Molecular Pathology section of the Results Review tab.  A portion of the result report is included below for reference.      Holly Gee, MS, [facility] Genetic Counselor [email or web address] Phone: [phone]

## 2023-08-02 NOTE — Progress Notes (Signed)
 HPI:   [name] was previously seen in the [facility] due to a personal history of cancer and concerns regarding a hereditary predisposition to cancer. Please refer to our prior cancer genetics clinic note for more information regarding our discussion, assessment and recommendations, at the time. [name] recent genetic test results were disclosed to her, as were recommendations warranted by these results. These results and recommendations are discussed in more detail below.  CANCER HISTORY:  Oncology History   No history exists.    FAMILY HISTORY:  We obtained a detailed, 4-generation family history.  Significant diagnoses are listed below: Family History  Problem Relation Age of Onset   Thyroid  cancer Mother    Hypertension Father    Diabetes Father    Thyroid  cancer Paternal Aunt    Heart disease Maternal Grandmother    Cancer Paternal Grandmother        uterine or ovarian    [name] has adopted children. She has 1 full sister who passed at 59. She has 1 paternal half sister, 1 paternal half brother, no cancers.   [name] mother had thyroid  cancer around age 35 and is living at 70. No other known cancers on this side of the family.   [name] father is living at 49, no history of cancer. A paternal aunt had thyroid  cancer at 27. Paternal grandmother had uterine or ovarian cancer and passed over 68.    [name] is unaware of previous family history of genetic testing for hereditary cancer risks. There is no reported Ashkenazi Jewish ancestry. There is no known consanguinity.     GENETIC TEST RESULTS:  The [facility] CancerNext-Expanded+RNA Panel found no pathogenic mutations.   The CancerNext-Expanded gene panel offered by [facility] and includes sequencing, rearrangement, and RNA analysis for the following 77 genes: AIP, ALK, APC, ATM, AXIN2, BAP1, BARD1, BMPR1A, BRCA1, BRCA2, BRIP1, CDC73, CDH1, CDK4, CDKN1B, CDKN2A, CEBPA, CHEK2,  CTNNA1, DDX41, DICER1, ETV6, FH, FLCN, GATA2, LZTR1, MAX, MBD4, MEN1, MET, MLH1, MSH2, MSH3, MSH6, MUTYH, NF1, NF2, NTHL1, PALB2, PHOX2B, PMS2, POT1, PRKAR1A, PTCH1, PTEN, RAD51C, RAD51D, RB1, RET, RPS20, RUNX1, SDHA, SDHAF2, SDHB, SDHC, SDHD, SMAD4, SMARCA4, SMARCB1, SMARCE1, STK11, SUFU, TMEM127, TP53, TSC1, TSC2, VHL, and WT1 (sequencing and deletion/duplication); EGFR, HOXB13, KIT, MITF, PDGFRA, POLD1, and POLE (sequencing only); EPCAM and GREM1 (deletion/duplication only).   The test report has been scanned into EPIC and is located under the Molecular Pathology section of the Results Review tab.  A portion of the result report is included below for reference. Genetic testing reported out on 07/31/2023.     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  At this time, it is unknown if this variant is associated with an increased risk for cancer or if it is benign, but most uncertain variants are reclassified to benign. It should not be used to make medical management decisions. With time, we suspect the laboratory will determine the significance of this variant, if any. If the laboratory reclassifies this variant, we will attempt to contact [name] to discuss it further.   Even though a pathogenic variant was not identified, possible explanations for the cancer in the family may include: There may be no hereditary risk for cancer in the family. The cancers in [name] and/or her family may be sporadic/familial or due to other genetic and environmental factors. There may be a gene mutation in one of these genes that current testing methods  cannot detect but that chance is small. There could be another gene that has not yet been discovered, or that we have not yet tested, that is responsible for the cancer diagnoses in the family.  It is also possible there is a hereditary cause for the cancer in the family that [name] did not inherit.  Therefore,  it is important to remain in touch with cancer genetics in the future so that we can continue to offer [name] the most up to date genetic testing.   ADDITIONAL GENETIC TESTING:  We discussed with [name] that her genetic testing was fairly extensive.  If there are additional relevant genes identified to increase cancer risk that can be analyzed in the future, we would be happy to discuss and coordinate this testing at that time.    CANCER SCREENING RECOMMENDATIONS:  [name] test result is considered negative (normal).  This means that we have not identified a hereditary cause for her personal and family history of cancer at this time.   An individual's cancer risk and medical management are not determined by genetic test results alone. Overall cancer risk assessment incorporates additional factors, including personal medical history, family history, and any available genetic information that may result in a personalized plan for cancer prevention and surveillance. Therefore, it is recommended she continue to follow the cancer management and screening guidelines provided by her primary healthcare provider.  RECOMMENDATIONS FOR FAMILY MEMBERS:   Individuals in this family might be at some increased risk of developing cancer, over the general population risk, due to the family history of cancer.  Individuals in the family should notify their providers of the family history of cancer. We recommend women in this family have a yearly mammogram beginning at age 22, or 50 years younger than the earliest onset of cancer, an annual clinical breast exam, and perform monthly breast self-exams.  Family members should have colonoscopies by at age 67, or earlier, as recommended by their providers We do not recommend familial testing for the AXIN2 variant of uncertain significance (VUS).  FOLLOW-UP:  Lastly, we discussed with [name] that cancer genetics is a rapidly advancing field and it is  possible that new genetic tests will be appropriate for her and/or her family members in the future. We encouraged her to remain in contact with cancer genetics on an annual basis so we can update her personal and family histories and let her know of advances in cancer genetics that may benefit this family.   Our contact number was provided. [name] questions were answered to her satisfaction, and she knows she is welcome to call us  at anytime with additional questions or concerns.    Holly Gee, MS, [facility] Genetic Counselor [email or web address] Phone: [phone]

## 2023-08-15 ENCOUNTER — Inpatient Hospital Stay

## 2023-08-16 ENCOUNTER — Inpatient Hospital Stay: Attending: Oncology

## 2023-08-16 DIAGNOSIS — D509 Iron deficiency anemia, unspecified: Secondary | ICD-10-CM | POA: Diagnosis present

## 2023-08-16 DIAGNOSIS — E538 Deficiency of other specified B group vitamins: Secondary | ICD-10-CM | POA: Insufficient documentation

## 2023-08-16 DIAGNOSIS — Z9884 Bariatric surgery status: Secondary | ICD-10-CM

## 2023-08-16 LAB — CBC WITH DIFFERENTIAL (CANCER CENTER ONLY)
Abs Immature Granulocytes: 0.01 10*3/uL (ref 0.00–0.07)
Basophils Absolute: 0 10*3/uL (ref 0.0–0.1)
Basophils Relative: 0 %
Eosinophils Absolute: 0.1 10*3/uL (ref 0.0–0.5)
Eosinophils Relative: 1 %
HCT: 36.9 % (ref 36.0–46.0)
Hemoglobin: 12.2 g/dL (ref 12.0–15.0)
Immature Granulocytes: 0 %
Lymphocytes Relative: 36 %
Lymphs Abs: 1.7 10*3/uL (ref 0.7–4.0)
MCH: 28.1 pg (ref 26.0–34.0)
MCHC: 33.1 g/dL (ref 30.0–36.0)
MCV: 85 fL (ref 80.0–100.0)
Monocytes Absolute: 0.4 10*3/uL (ref 0.1–1.0)
Monocytes Relative: 9 %
Neutro Abs: 2.4 10*3/uL (ref 1.7–7.7)
Neutrophils Relative %: 54 %
Platelet Count: 202 10*3/uL (ref 150–400)
RBC: 4.34 MIL/uL (ref 3.87–5.11)
RDW: 15.3 % (ref 11.5–15.5)
WBC Count: 4.6 10*3/uL (ref 4.0–10.5)
nRBC: 0 % (ref 0.0–0.2)

## 2023-08-16 LAB — RETIC PANEL
Immature Retic Fract: 8.4 % (ref 2.3–15.9)
RBC.: 4.33 MIL/uL (ref 3.87–5.11)
Retic Count, Absolute: 45 10*3/uL (ref 19.0–186.0)
Retic Ct Pct: 1 % (ref 0.4–3.1)
Reticulocyte Hemoglobin: 31.3 pg (ref >27.9)

## 2023-08-16 LAB — IRON AND TIBC
Iron: 52 ug/dL (ref 28–170)
Saturation Ratios: 11 % (ref 10.4–31.8)
TIBC: 461 ug/dL — ABNORMAL HIGH (ref 250–450)
UIBC: 409 ug/dL

## 2023-08-16 LAB — FERRITIN: Ferritin: 4 ng/mL — ABNORMAL LOW (ref 11–307)

## 2023-08-16 LAB — FOLATE: Folate: 19 ng/mL (ref >5.9)

## 2023-08-16 LAB — VITAMIN B12: Vitamin B-12: 153 pg/mL — ABNORMAL LOW (ref 180–914)

## 2023-08-18 ENCOUNTER — Inpatient Hospital Stay (HOSPITAL_BASED_OUTPATIENT_CLINIC_OR_DEPARTMENT_OTHER): Admitting: Oncology

## 2023-08-18 ENCOUNTER — Inpatient Hospital Stay

## 2023-08-18 ENCOUNTER — Encounter: Payer: Self-pay | Admitting: Oncology

## 2023-08-18 VITALS — BP 138/90 | HR 71 | Resp 18

## 2023-08-18 VITALS — BP 134/86 | HR 91 | Temp 97.0°F | Resp 18 | Wt 174.8 lb

## 2023-08-18 DIAGNOSIS — D508 Other iron deficiency anemias: Secondary | ICD-10-CM

## 2023-08-18 DIAGNOSIS — E538 Deficiency of other specified B group vitamins: Secondary | ICD-10-CM

## 2023-08-18 DIAGNOSIS — D509 Iron deficiency anemia, unspecified: Secondary | ICD-10-CM | POA: Diagnosis not present

## 2023-08-18 DIAGNOSIS — Z9884 Bariatric surgery status: Secondary | ICD-10-CM

## 2023-08-18 MED ORDER — IRON SUCROSE 20 MG/ML IV SOLN
200.0000 mg | Freq: Once | INTRAVENOUS | Status: AC
  Administered 2023-08-18: 200 mg via INTRAVENOUS
  Filled 2023-08-18: qty 10

## 2023-08-18 MED ORDER — SODIUM CHLORIDE 0.9% FLUSH
10.0000 mL | Freq: Once | INTRAVENOUS | Status: AC | PRN
  Administered 2023-08-18: 10 mL
  Filled 2023-08-18: qty 10

## 2023-08-18 MED ORDER — CYANOCOBALAMIN 1000 MCG/ML IJ SOLN
1000.0000 ug | Freq: Once | INTRAMUSCULAR | Status: AC
  Administered 2023-08-18: 1000 ug via INTRAMUSCULAR
  Filled 2023-08-18: qty 1

## 2023-08-18 NOTE — Assessment & Plan Note (Signed)
 Check B12, folate periodically.

## 2023-08-18 NOTE — Assessment & Plan Note (Signed)
 Recommend B12 1000mcg IM injection weekly x 4 followed by monthly B12 injections.

## 2023-08-18 NOTE — Assessment & Plan Note (Addendum)
 Labs are reviewed and discussed with patient. Results are consistent with iron  deficient anemia.   History of gastric bypass.  Lab Results  Component Value Date   HGB 12.2 08/16/2023   TIBC 461 (H) 08/16/2023   IRONPCTSAT 11 08/16/2023   FERRITIN 4 (L) 08/16/2023    Recommend IV venofer  weekly x 3 Patient has history of infertility, and also uses Condom. She denies any chance of pregnancy and declines pregnancy testing prior to Venofer .

## 2023-08-18 NOTE — Progress Notes (Signed)
 Hematology/Oncology Progress note [phone] [phone]           REFERRING PROVIDER: Nestor Banter, MD   CHIEF COMPLAINTS/REASON FOR VISIT:  Iron  deficiency anemia, B12 deficiency   ASSESSMENT & PLAN:   Iron  deficiency anemia Labs are reviewed and discussed with patient. Results are consistent with iron  deficient anemia.   History of gastric bypass.  Lab Results  Component Value Date   HGB 12.2 08/16/2023   TIBC 461 (H) 08/16/2023   IRONPCTSAT 11 08/16/2023   FERRITIN 4 (L) 08/16/2023    Recommend IV venofer  weekly x 3 Patient has history of infertility, and also uses Condom. She denies any chance of pregnancy and declines pregnancy testing prior to Venofer .    History of gastric bypass Check B12, folate periodically.   B12 deficiency Recommend B12 1000mcg IM injection weekly x 4 followed by monthly B12 injections.    Orders Placed This Encounter  Procedures   CBC with Differential (Cancer Center Only)    Standing Status:   Future    Expected Date:   12/18/2023    Expiration Date:   03/17/2024   Iron  and TIBC    Standing Status:   Future    Expected Date:   12/18/2023    Expiration Date:   03/17/2024   Ferritin    Standing Status:   Future    Expected Date:   12/18/2023    Expiration Date:   03/17/2024   Retic Panel    Standing Status:   Future    Expected Date:   12/18/2023    Expiration Date:   03/17/2024   Vitamin B12    Standing Status:   Future    Expected Date:   12/18/2023    Expiration Date:   03/17/2024   Follow up 4 months All questions were answered. The patient knows to call the clinic with any problems, questions or concerns.  Timmy Forbes, MD, PhD [facility] Hematology Oncology 08/18/2023   HISTORY OF PRESENTING ILLNESS:   [name] is a  49 y.o.  female with PMH listed below was seen in consultation at the request of  Nestor Banter, MD  for evaluation of microcytic anemia.   She was diagnosed with iron   deficiency anemia In Dec 2024.  She has been on oral iron  supplements since December, approximately two to three months ago, and is tolerating them well. Despite this, recent blood work on 05/10/2023 revealed Hb 9.4, and iron  saturation 4, TIBC 513, She has a history of gastric bypass, which may contribute to malabsorption of iron . No heavy menstrual bleeding, stomach pain, blood in the stool, or dark stool. She feels always tiredbut has no palpitations, heart racing, shortness of breath, or lightheadedness.   Her past medical history includes mucinous adenocarcinoma of the right ovary, treated with right oophorectomy and a history of thyroid  issues. No family history of ovarian or breast cancer, but her mother had thyroid  cancer.  She is sexually active, denies any chance of pregnancy, and uses condoms as a contraceptive measure. She has a history of infertility and has four adopted children. She has a history of right infratentorial meningioma.   She inquired about Cowden syndrome due to her history of brain tumor, ovarian cancer, and thyroid  issues. She is interested in genetic testing for this condition.  INTERVAL HISTORY [name] is a 48 y.o. female who has above history reviewed by me today presents for follow up visit for iron  deficiency anemia and B12  deficiency.  She tolerated IV venofer  well. Still has fatigue.   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 Bx showed follicular adenoma.   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   Pseudotumor cerebri syndrome    Sleep apnea    a.) mild; does NOT use nocturnal PAP therapy   VSD (ventricular septal defect)    a.) TTE 04/07/2012: EF 55-60%; small membranous VSD with L-R flow. b.) TTE 05/10/2014: very small membranou sVSD; normal RVSP. c.) TTE 11/30/2019: EF 59%; tiny hemodynamically non-significant perimembranous VSD.    SURGICAL HISTORY: Past Surgical History:  Procedure Laterality Date   ANGIOPLASTY     BACK SURGERY     x 2   BRAIN SURGERY  2016   2 stents in right transverse sinus/ tumor-partial removal of benign tumor   FOOT SURGERY Bilateral    HARDWARE REMOVAL Bilateral 01/09/2021   Procedure: SILICON IMPLANT REMOVAL;  Surgeon: Anell Baptist, DPM;  Location: [facility] ORS;  Service: Podiatry;  Laterality: Bilateral;  SECOND TOE   LAPAROSCOPIC GASTRIC BYPASS  05/2020   OOPHORECTOMY Right    cancer   SPINAL FUSION     spinal tap     THYROID  SURGERY Right    hemithyroidectomy    SOCIAL HISTORY: Social History   Socioeconomic History   Marital status: Married    Spouse name: Not on file   Number of children: Not on file   Years of education: Not on file   Highest education level: Not on file  Occupational History   Not on file  Tobacco Use   Smoking status: Never   Smokeless tobacco: Never  Substance and Sexual Activity   Alcohol use: No   Drug use: No   Sexual activity: Yes  Other Topics Concern   Not on file  Social History Narrative   Not on file   Social Drivers of Health   Financial Resource Strain: Low Risk  (05/17/2023)   Overall Financial Resource Strain (CARDIA)    Difficulty of Paying Living Expenses: Not very hard  Food Insecurity: No Food Insecurity (05/17/2023)   Hunger Vital Sign    Worried About Running Out of Food in the Last Year: Never true    Ran Out of Food in the Last Year:  Never true  Transportation Needs: No Transportation Needs (05/17/2023)   PRAPARE - Administrator, Civil Service (Medical): No    Lack of Transportation (Non-Medical): No  Physical Activity: Patient Declined (09/13/2022)   Received from [facility]   Exercise Vital Sign    On average, how many days per week do you engage in moderate to strenuous exercise (like a brisk walk)?: Patient declined    On average, how many minutes do you engage in exercise at this level?: Patient declined  Stress: Patient Declined (09/13/2022)  Received from [facility] - Occupational Stress Questionnaire    Feeling of Stress : Patient declined  Social Connections: Unknown (07/29/2021)   Received from [facility]   Social Network    Social Network: Not on file  Intimate Partner Violence: Not At Risk (09/13/2022)   Received from [facility]   HITS    Over the last 12 months how often did your partner physically hurt you?: Never    Over the last 12 months how often did your partner insult you or talk down to you?: Never    Over the last 12 months how often did your partner threaten you with physical harm?: Never    Over the last 12 months how often did your partner scream or curse at you?: Never    FAMILY HISTORY: Family History  Problem Relation Age of Onset   Thyroid  cancer Mother    Hypertension Father    Diabetes Father    Thyroid  cancer Paternal Aunt    Heart disease Maternal Grandmother    Cancer Paternal Grandmother        uterine or ovarian    ALLERGIES:  is allergic to morphine and penicillins.  MEDICATIONS:  Current Outpatient Medications  Medication Sig Dispense Refill   aspirin 81 MG tablet Take 81 mg by mouth at bedtime.      DULoxetine (CYMBALTA) 60 MG capsule Take 60 mg by mouth at bedtime.     iron  polysaccharides (NIFEREX) 150 MG capsule Take by mouth.     nortriptyline (PAMELOR) 50 MG capsule Take 50 mg by mouth at bedtime.      pregabalin (LYRICA) 50 MG capsule Take 50 mg by mouth 2 (two) times daily.     HYDROcodone -acetaminophen  (NORCO) 10-325 MG tablet Take 1 tablet by mouth every 6 (six) hours as needed. (Patient not taking: Reported on 08/18/2023) 20 tablet 0   oxyCODONE -acetaminophen  (PERCOCET) 5-325 MG tablet Take 1-2 tablets by mouth every 6 (six) hours as needed for severe pain. Max 6 tabs per day (Patient not taking: Reported on 08/18/2023) 30 tablet 0   pregabalin (LYRICA) 25 MG capsule Take by mouth. (Patient not taking: Reported on 08/18/2023)     No current facility-administered medications for this visit.    Review of Systems  Constitutional:  Positive for fatigue. Negative for appetite change, chills and fever.  HENT:   Negative for hearing loss and voice change.   Eyes:  Negative for eye problems.  Respiratory:  Negative for chest tightness and cough.   Cardiovascular:  Negative for chest pain.  Gastrointestinal:  Negative for abdominal distention, abdominal pain and blood in stool.  Endocrine: Negative for hot flashes.  Genitourinary:  Negative for difficulty urinating and frequency.   Musculoskeletal:  Negative for arthralgias.  Skin:  Negative for itching and rash.  Neurological:  Negative for extremity weakness.  Hematological:  Negative for adenopathy.  Psychiatric/Behavioral:  Negative for confusion.    PHYSICAL EXAMINATION:  Vitals:   08/18/23 1027  BP: 134/86  Pulse: 91  Resp: 18  Temp: (!) 97 F (36.1 C)  SpO2: 98%   Filed Weights   08/18/23 1027  Weight: 174 lb 12.8 oz (79.3 kg)    Physical Exam Constitutional:      General: She is not in acute distress. HENT:     Head: Normocephalic and atraumatic.   Eyes:     General: No scleral icterus.   Cardiovascular:     Rate and Rhythm:  Normal rate and regular rhythm.     Heart sounds: Normal heart sounds.  Pulmonary:     Effort: Pulmonary effort is normal. No respiratory distress.     Breath sounds: No wheezing.   Abdominal:     General: Bowel sounds are normal. There is no distension.     Palpations: Abdomen is soft.   Musculoskeletal:        General: No deformity. Normal range of motion.     Cervical back: Normal range of motion and neck supple.   Skin:    General: Skin is warm and dry.     Findings: No erythema or rash.   Neurological:     Mental Status: She is alert and oriented to person, place, and time. Mental status is at baseline.   Psychiatric:        Mood and Affect: Mood normal.     LABORATORY DATA:  I have reviewed the data as listed    Latest Ref Rng & Units 08/16/2023    9:07 AM 06/09/2023    8:20 AM 12/23/2010   10:53 AM  CBC  WBC 4.0 - 10.5 K/uL 4.6  4.3  6.1   Hemoglobin 12.0 - 15.0 g/dL 16.1  09.6  04.5   Hematocrit 36.0 - 46.0 % 36.9  34.0  39.9   Platelets 150 - 400 K/uL 202  244  232       Latest Ref Rng & Units 12/23/2010   10:53 AM 05/30/2009    8:48 AM  CMP  Glucose 70 - 99 mg/dL 89  98   BUN 6 - 23 mg/dL 11  10   Creatinine 4.09 - 1.10 mg/dL 8.11  0.7   Sodium 914 - 145 mEq/L 138  141   Potassium 3.5 - 5.1 mEq/L 4.0  3.9   Chloride 96 - 112 mEq/L 102  103   CO2 19 - 32 mEq/L 27  30   Calcium 8.4 - 10.5 mg/dL 9.7  9.5       RADIOGRAPHIC STUDIES: I have personally reviewed the radiological images as listed and agreed with the findings in the report. No results found.

## 2023-08-25 ENCOUNTER — Ambulatory Visit: Admitting: Urology

## 2023-08-26 ENCOUNTER — Inpatient Hospital Stay

## 2023-08-26 VITALS — BP 120/79 | HR 84 | Temp 97.6°F | Resp 17

## 2023-08-26 DIAGNOSIS — D508 Other iron deficiency anemias: Secondary | ICD-10-CM

## 2023-08-26 DIAGNOSIS — D509 Iron deficiency anemia, unspecified: Secondary | ICD-10-CM | POA: Diagnosis not present

## 2023-08-26 MED ORDER — IRON SUCROSE 20 MG/ML IV SOLN
200.0000 mg | Freq: Once | INTRAVENOUS | Status: AC
  Administered 2023-08-26: 200 mg via INTRAVENOUS
  Filled 2023-08-26: qty 10

## 2023-08-26 MED ORDER — CYANOCOBALAMIN 1000 MCG/ML IJ SOLN
1000.0000 ug | Freq: Once | INTRAMUSCULAR | Status: AC
  Administered 2023-08-26: 1000 ug via INTRAMUSCULAR
  Filled 2023-08-26: qty 1

## 2023-08-26 MED ORDER — SODIUM CHLORIDE 0.9% FLUSH
10.0000 mL | Freq: Once | INTRAVENOUS | Status: AC | PRN
  Administered 2023-08-26: 10 mL
  Filled 2023-08-26: qty 10

## 2023-08-26 NOTE — Patient Instructions (Signed)

## 2023-08-26 NOTE — Progress Notes (Signed)
Patient tolerated Venofer infusion well. Explained recommendation of 30 min post monitoring. Patient refused to wait post monitoring. Educated on what signs to watch for & to call with any concerns. No questions, discharged. Stable  

## 2023-09-02 ENCOUNTER — Inpatient Hospital Stay

## 2023-09-07 ENCOUNTER — Inpatient Hospital Stay: Attending: Oncology

## 2023-09-07 VITALS — BP 128/90 | HR 77 | Temp 98.0°F | Resp 16

## 2023-09-07 DIAGNOSIS — D509 Iron deficiency anemia, unspecified: Secondary | ICD-10-CM | POA: Diagnosis present

## 2023-09-07 DIAGNOSIS — E538 Deficiency of other specified B group vitamins: Secondary | ICD-10-CM | POA: Insufficient documentation

## 2023-09-07 DIAGNOSIS — D508 Other iron deficiency anemias: Secondary | ICD-10-CM

## 2023-09-07 MED ORDER — CYANOCOBALAMIN 1000 MCG/ML IJ SOLN
1000.0000 ug | Freq: Once | INTRAMUSCULAR | Status: AC
  Administered 2023-09-07: 1000 ug via INTRAMUSCULAR
  Filled 2023-09-07: qty 1

## 2023-09-07 MED ORDER — IRON SUCROSE 20 MG/ML IV SOLN
200.0000 mg | Freq: Once | INTRAVENOUS | Status: AC
  Administered 2023-09-07: 200 mg via INTRAVENOUS
  Filled 2023-09-07: qty 10

## 2023-09-08 ENCOUNTER — Ambulatory Visit

## 2023-09-14 ENCOUNTER — Inpatient Hospital Stay

## 2023-09-14 DIAGNOSIS — D509 Iron deficiency anemia, unspecified: Secondary | ICD-10-CM | POA: Diagnosis not present

## 2023-09-14 DIAGNOSIS — D508 Other iron deficiency anemias: Secondary | ICD-10-CM

## 2023-09-14 MED ORDER — CYANOCOBALAMIN 1000 MCG/ML IJ SOLN
1000.0000 ug | Freq: Once | INTRAMUSCULAR | Status: AC
  Administered 2023-09-14: 1000 ug via INTRAMUSCULAR
  Filled 2023-09-14: qty 1

## 2023-10-10 ENCOUNTER — Inpatient Hospital Stay: Attending: Oncology

## 2023-11-10 ENCOUNTER — Inpatient Hospital Stay: Attending: Oncology

## 2023-11-10 DIAGNOSIS — E538 Deficiency of other specified B group vitamins: Secondary | ICD-10-CM | POA: Diagnosis present

## 2023-11-10 DIAGNOSIS — D508 Other iron deficiency anemias: Secondary | ICD-10-CM

## 2023-11-10 MED ORDER — CYANOCOBALAMIN 1000 MCG/ML IJ SOLN
1000.0000 ug | Freq: Once | INTRAMUSCULAR | Status: AC
  Administered 2023-11-10: 1000 ug via INTRAMUSCULAR
  Filled 2023-11-10: qty 1

## 2023-12-12 ENCOUNTER — Inpatient Hospital Stay: Attending: Oncology

## 2023-12-12 DIAGNOSIS — E538 Deficiency of other specified B group vitamins: Secondary | ICD-10-CM | POA: Diagnosis present

## 2023-12-12 DIAGNOSIS — D508 Other iron deficiency anemias: Secondary | ICD-10-CM

## 2023-12-12 LAB — CBC WITH DIFFERENTIAL (CANCER CENTER ONLY)
Abs Immature Granulocytes: 0.02 K/uL (ref 0.00–0.07)
Basophils Absolute: 0 K/uL (ref 0.0–0.1)
Basophils Relative: 1 %
Eosinophils Absolute: 0.1 K/uL (ref 0.0–0.5)
Eosinophils Relative: 1 %
HCT: 41.3 % (ref 36.0–46.0)
Hemoglobin: 14.1 g/dL (ref 12.0–15.0)
Immature Granulocytes: 0 %
Lymphocytes Relative: 29 %
Lymphs Abs: 1.7 K/uL (ref 0.7–4.0)
MCH: 31.3 pg (ref 26.0–34.0)
MCHC: 34.1 g/dL (ref 30.0–36.0)
MCV: 91.6 fL (ref 80.0–100.0)
Monocytes Absolute: 0.3 K/uL (ref 0.1–1.0)
Monocytes Relative: 5 %
Neutro Abs: 3.9 K/uL (ref 1.7–7.7)
Neutrophils Relative %: 64 %
Platelet Count: 244 K/uL (ref 150–400)
RBC: 4.51 MIL/uL (ref 3.87–5.11)
RDW: 12.1 % (ref 11.5–15.5)
WBC Count: 6 K/uL (ref 4.0–10.5)
nRBC: 0 % (ref 0.0–0.2)

## 2023-12-12 LAB — VITAMIN B12: Vitamin B-12: 223 pg/mL (ref 180–914)

## 2023-12-12 LAB — IRON AND TIBC
Iron: 95 ug/dL (ref 28–170)
Saturation Ratios: 23 % (ref 10.4–31.8)
TIBC: 417 ug/dL (ref 250–450)
UIBC: 322 ug/dL

## 2023-12-12 LAB — RETIC PANEL
Immature Retic Fract: 10.2 % (ref 2.3–15.9)
RBC.: 4.43 MIL/uL (ref 3.87–5.11)
Retic Count, Absolute: 71.3 K/uL (ref 19.0–186.0)
Retic Ct Pct: 1.6 % (ref 0.4–3.1)
Reticulocyte Hemoglobin: 35.3 pg (ref >27.9)

## 2023-12-12 LAB — FERRITIN: Ferritin: 18 ng/mL (ref 11–307)

## 2023-12-16 ENCOUNTER — Inpatient Hospital Stay: Admitting: Oncology

## 2023-12-16 ENCOUNTER — Encounter: Payer: Self-pay | Admitting: Oncology

## 2023-12-16 ENCOUNTER — Inpatient Hospital Stay

## 2023-12-16 VITALS — BP 139/85 | HR 65 | Temp 97.6°F | Resp 18 | Wt 185.9 lb

## 2023-12-16 DIAGNOSIS — D508 Other iron deficiency anemias: Secondary | ICD-10-CM

## 2023-12-16 DIAGNOSIS — E538 Deficiency of other specified B group vitamins: Secondary | ICD-10-CM

## 2023-12-16 DIAGNOSIS — Z8543 Personal history of malignant neoplasm of ovary: Secondary | ICD-10-CM

## 2023-12-16 MED ORDER — CYANOCOBALAMIN 1000 MCG/ML IJ SOLN
1000.0000 ug | Freq: Once | INTRAMUSCULAR | Status: AC
  Administered 2023-12-16: 1000 ug via INTRAMUSCULAR
  Filled 2023-12-16: qty 1

## 2023-12-16 NOTE — Assessment & Plan Note (Signed)
 History of right Primary mucinous adenocarcinoma of ovary diagnosed in 2001, s/p Right oophorectomy She had negative genetic testing.

## 2023-12-16 NOTE — Progress Notes (Signed)
 Hematology/Oncology Progress note [phone] [phone]           REFERRING PROVIDER: Diedra Lame, MD   CHIEF COMPLAINTS/REASON FOR VISIT:  Iron  deficiency anemia, B12 deficiency   ASSESSMENT & PLAN:   Iron  deficiency anemia Labs are reviewed and discussed with patient. Results are consistent with iron  deficient anemia.   History of gastric bypass.  Lab Results  Component Value Date   HGB 14.1 12/12/2023   TIBC 417 12/12/2023   IRONPCTSAT 23 12/12/2023   FERRITIN 18 12/12/2023    No need for Venofer .    B12 deficiency Continue monthly B12 injections.   History of ovarian cancer History of right Primary mucinous adenocarcinoma of ovary diagnosed in 2001, s/p Right oophorectomy She had negative genetic testing.    Orders Placed This Encounter  Procedures   CBC with Differential (Cancer Center Only)    Standing Status:   Future    Expected Date:   06/15/2024    Expiration Date:   09/13/2024   Iron  and TIBC    Standing Status:   Future    Expected Date:   06/15/2024    Expiration Date:   09/13/2024   Ferritin    Standing Status:   Future    Expected Date:   06/15/2024    Expiration Date:   09/13/2024   Vitamin B12    Standing Status:   Future    Expected Date:   06/15/2024    Expiration Date:   09/13/2024   Follow up 4 months All questions were answered. The patient knows to call the clinic with any problems, questions or concerns.  Zelphia Cap, MD, PhD [facility] Hematology Oncology 12/16/2023   HISTORY OF PRESENTING ILLNESS:   [name] is a  48 y.o.  female with PMH listed below was seen in consultation at the request of  Diedra Lame, MD  for evaluation of microcytic anemia.   She was diagnosed with iron  deficiency anemia In Dec 2024.  She has been on oral iron  supplements since December, approximately two to three months ago, and is tolerating them well. Despite this, recent blood work on 05/10/2023 revealed Hb 9.4, and iron   saturation 4, TIBC 513, She has a history of gastric bypass, which may contribute to malabsorption of iron . No heavy menstrual bleeding, stomach pain, blood in the stool, or dark stool. She feels always tiredbut has no palpitations, heart racing, shortness of breath, or lightheadedness.   Her past medical history includes mucinous adenocarcinoma of the right ovary, treated with right oophorectomy and a history of thyroid  issues. No family history of ovarian or breast cancer, but her mother had thyroid  cancer.  She is sexually active, denies any chance of pregnancy, and uses condoms as a contraceptive measure. She has a history of infertility and has four adopted children. She has a history of right infratentorial meningioma.   She inquired about Cowden syndrome due to her history of brain tumor, ovarian cancer, and thyroid  issues. She is interested in genetic testing for this condition.  INTERVAL HISTORY [name] is a 48 y.o. female who has above history reviewed by me today presents for follow up visit for iron  deficiency anemia and B12 deficiency.  She tolerated IV venofer  well. She is on monthly B12 injection.  Still has fatigue.   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 Bx showed follicular adenoma.   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   Pseudotumor cerebri syndrome    Sleep  apnea    a.) mild; does NOT use nocturnal PAP therapy   VSD (ventricular septal defect)    a.) TTE 04/07/2012: EF 55-60%; small membranous VSD with L-R flow. b.) TTE 05/10/2014: very small membranou sVSD; normal RVSP. c.) TTE 11/30/2019: EF 59%; tiny hemodynamically non-significant perimembranous VSD.    SURGICAL HISTORY: Past Surgical History:  Procedure Laterality Date   ANGIOPLASTY     BACK SURGERY     x 2   BRAIN SURGERY  2016   2 stents in right transverse sinus/ tumor-partial removal of benign tumor   FOOT SURGERY Bilateral    HARDWARE REMOVAL Bilateral 01/09/2021   Procedure: SILICON IMPLANT REMOVAL;  Surgeon: Ashley Soulier, DPM;  Location: [facility] ORS;  Service: Podiatry;  Laterality: Bilateral;  SECOND TOE   LAPAROSCOPIC GASTRIC BYPASS  05/2020   OOPHORECTOMY Right    cancer   SPINAL FUSION     spinal tap     THYROID  SURGERY Right    hemithyroidectomy    SOCIAL HISTORY: Social History   Socioeconomic History   Marital status: Married    Spouse name: Not on file   Number of children: Not on file   Years of education: Not on file   Highest education level: Not on file  Occupational History   Not on file  Tobacco Use   Smoking status: Never   Smokeless tobacco: Never  Substance and Sexual Activity   Alcohol use: No   Drug use: No   Sexual activity: Yes  Other Topics Concern   Not on file  Social History Narrative   Not on file   Social Drivers of Health   Financial Resource Strain: Patient Declined (11/02/2023)   Received from [facility]   Overall Financial Resource Strain (CARDIA)    Difficulty of Paying Living Expenses: Patient declined  Food Insecurity: Patient Declined (11/02/2023)   Received from [facility]   Hunger Vital Sign    Within the past 12 months, you worried that your food would run out before you got the money to buy more.: Patient declined    Within the past 12 months, the food you bought just didn't  last and you didn't have money to get more.: Patient declined  Transportation Needs: Patient Declined (11/02/2023)   Received from [facility] - Transportation    In the past 12 months, has lack of transportation kept you from medical appointments or from getting medications?: Patient declined    Lack of Transportation (Non-Medical): Patient declined  Physical Activity: Patient Declined (09/13/2022)   Received from [facility]   Exercise Vital Sign    On average, how many days per week do you engage in moderate to strenuous exercise (like a brisk walk)?: Patient declined    On average, how many minutes  do you engage in exercise at this level?: Patient declined  Stress: Patient Declined (09/13/2022)   Received from [facility] - Occupational Stress Questionnaire    Feeling of Stress : Patient declined  Social Connections: Unknown (07/29/2021)   Received from [facility]   Social Network    Social Network: Not on file  Intimate Partner Violence: Not At Risk (09/13/2022)   Received from [facility]   HITS    Over the last 12 months how often did your partner physically hurt you?: Never    Over the last 12 months how often did your partner insult you or talk down to you?: Never    Over the last 12 months how often did your partner threaten you with physical harm?: Never    Over the last 12 months how often did your partner scream or curse at you?: Never    FAMILY HISTORY: Family History  Problem Relation Age of Onset   Thyroid  cancer Mother    Hypertension Father    Diabetes Father    Thyroid  cancer Paternal Aunt    Heart disease Maternal Grandmother    Cancer Paternal Grandmother        uterine or ovarian    ALLERGIES:  is allergic to morphine and penicillins.  MEDICATIONS:  Current Outpatient Medications  Medication Sig Dispense Refill   aspirin 81 MG tablet Take 81 mg by mouth at bedtime.       DULoxetine (CYMBALTA) 60 MG capsule Take 60 mg by mouth at bedtime.     HYDROcodone -acetaminophen  (NORCO) 10-325 MG tablet Take 1 tablet by mouth every 6 (six) hours as needed. 20 tablet 0   iron  polysaccharides (NIFEREX) 150 MG capsule Take by mouth.     nortriptyline (PAMELOR) 50 MG capsule Take 50 mg by mouth at bedtime.     oxyCODONE -acetaminophen  (PERCOCET) 5-325 MG tablet Take 1-2 tablets by mouth every 6 (six) hours as needed for severe pain. Max 6 tabs per day 30 tablet 0   pregabalin (LYRICA) 25 MG capsule Take by mouth. (Patient not taking: Reported on 12/16/2023)     pregabalin (LYRICA) 50 MG capsule Take 50 mg by mouth 2 (two) times daily. (Patient not taking: Reported on 12/16/2023)     No current facility-administered medications for this visit.    Review of Systems  Constitutional:  Positive for fatigue. Negative for appetite change, chills and fever.  HENT:   Negative for hearing loss and voice change.   Eyes:  Negative for eye problems.  Respiratory:  Negative for chest tightness and cough.   Cardiovascular:  Negative for chest pain.  Gastrointestinal:  Negative for abdominal distention, abdominal pain and blood in stool.  Endocrine: Negative for hot flashes.  Genitourinary:  Negative for difficulty urinating and frequency.   Musculoskeletal:  Negative for arthralgias.  Skin:  Negative for itching and rash.  Neurological:  Negative for extremity weakness.  Hematological:  Negative for adenopathy.  Psychiatric/Behavioral:  Negative for confusion.    PHYSICAL EXAMINATION:  Vitals:   12/16/23 0947  BP: 139/85  Pulse: 65  Resp: 18  Temp: 97.6 F (36.4 C)   Filed Weights   12/16/23 0947  Weight: 185 lb 14.4 oz (84.3 kg)    Physical Exam Constitutional:      General: She is not in acute distress. HENT:     Head: Normocephalic and atraumatic.  Eyes:     General: No scleral icterus. Cardiovascular:  Rate and Rhythm: Normal rate and regular rhythm.      Heart sounds: Normal heart sounds.  Pulmonary:     Effort: Pulmonary effort is normal. No respiratory distress.     Breath sounds: No wheezing.  Abdominal:     General: Bowel sounds are normal. There is no distension.     Palpations: Abdomen is soft.  Musculoskeletal:        General: No deformity. Normal range of motion.     Cervical back: Normal range of motion and neck supple.  Skin:    General: Skin is warm and dry.     Findings: No erythema or rash.  Neurological:     Mental Status: She is alert and oriented to person, place, and time. Mental status is at baseline.  Psychiatric:        Mood and Affect: Mood normal.     LABORATORY DATA:  I have reviewed the data as listed    Latest Ref Rng & Units 12/12/2023    8:31 AM 08/16/2023    9:07 AM 06/09/2023    8:20 AM  CBC  WBC 4.0 - 10.5 K/uL 6.0  4.6  4.3   Hemoglobin 12.0 - 15.0 g/dL 85.8  87.7  89.1   Hematocrit 36.0 - 46.0 % 41.3  36.9  34.0   Platelets 150 - 400 K/uL 244  202  244       Latest Ref Rng & Units 12/23/2010   10:53 AM 05/30/2009    8:48 AM  CMP  Glucose 70 - 99 mg/dL 89  98   BUN 6 - 23 mg/dL 11  10   Creatinine 9.49 - 1.10 mg/dL 9.24  0.7   Sodium 864 - 145 mEq/L 138  141   Potassium 3.5 - 5.1 mEq/L 4.0  3.9   Chloride 96 - 112 mEq/L 102  103   CO2 19 - 32 mEq/L 27  30   Calcium 8.4 - 10.5 mg/dL 9.7  9.5       RADIOGRAPHIC STUDIES: I have personally reviewed the radiological images as listed and agreed with the findings in the report. No results found.

## 2023-12-16 NOTE — Assessment & Plan Note (Signed)
Continue monthly B12 injections

## 2023-12-16 NOTE — Assessment & Plan Note (Addendum)
 Labs are reviewed and discussed with patient. Results are consistent with iron  deficient anemia.   History of gastric bypass.  Lab Results  Component Value Date   HGB 14.1 12/12/2023   TIBC 417 12/12/2023   IRONPCTSAT 23 12/12/2023   FERRITIN 18 12/12/2023    No need for Venofer .

## 2024-01-16 ENCOUNTER — Inpatient Hospital Stay: Attending: Oncology

## 2024-01-16 DIAGNOSIS — E538 Deficiency of other specified B group vitamins: Secondary | ICD-10-CM | POA: Diagnosis present

## 2024-01-16 DIAGNOSIS — D508 Other iron deficiency anemias: Secondary | ICD-10-CM

## 2024-01-16 MED ORDER — CYANOCOBALAMIN 1000 MCG/ML IJ SOLN
1000.0000 ug | Freq: Once | INTRAMUSCULAR | Status: AC
  Administered 2024-01-16: 1000 ug via INTRAMUSCULAR
  Filled 2024-01-16: qty 1

## 2024-02-15 ENCOUNTER — Inpatient Hospital Stay: Attending: Oncology

## 2024-03-19 ENCOUNTER — Telehealth: Payer: Self-pay | Admitting: Oncology

## 2024-03-19 ENCOUNTER — Inpatient Hospital Stay

## 2024-03-19 NOTE — Telephone Encounter (Signed)
 Patient called to reschedule injection appointment for today. Appointment rescheduled as requested.

## 2024-03-20 ENCOUNTER — Inpatient Hospital Stay: Attending: Oncology

## 2024-03-20 DIAGNOSIS — D508 Other iron deficiency anemias: Secondary | ICD-10-CM

## 2024-03-20 DIAGNOSIS — E538 Deficiency of other specified B group vitamins: Secondary | ICD-10-CM | POA: Insufficient documentation

## 2024-03-20 MED ORDER — CYANOCOBALAMIN 1000 MCG/ML IJ SOLN
1000.0000 ug | Freq: Once | INTRAMUSCULAR | Status: AC
  Administered 2024-03-20: 1000 ug via INTRAMUSCULAR
  Filled 2024-03-20: qty 1

## 2024-04-19 ENCOUNTER — Inpatient Hospital Stay: Attending: Oncology

## 2024-05-17 ENCOUNTER — Inpatient Hospital Stay: Attending: Oncology

## 2024-06-14 ENCOUNTER — Other Ambulatory Visit

## 2024-06-18 ENCOUNTER — Ambulatory Visit

## 2024-06-18 ENCOUNTER — Ambulatory Visit: Admitting: Oncology
